# Patient Record
Sex: Female | Born: 1955
Health system: Southern US, Community
[De-identification: ages and names within clinical notes are randomized; demographics above are authoritative.]

## PROBLEM LIST (undated history)

## (undated) DIAGNOSIS — Z5189 Encounter for other specified aftercare: Secondary | ICD-10-CM

## (undated) DIAGNOSIS — D219 Benign neoplasm of connective and other soft tissue, unspecified: Secondary | ICD-10-CM

## (undated) DIAGNOSIS — E785 Hyperlipidemia, unspecified: Secondary | ICD-10-CM

## (undated) HISTORY — DX: Encounter for other specified aftercare: Z51.89

## (undated) HISTORY — PX: OTHER SURGICAL HISTORY: SHX169

## (undated) HISTORY — PX: CHOLECYSTECTOMY: SHX55

## (undated) HISTORY — PX: BREAST REDUCTION SURGERY: SHX8

## (undated) HISTORY — DX: Hyperlipidemia, unspecified: E78.5

## (undated) HISTORY — DX: Benign neoplasm of connective and other soft tissue, unspecified: D21.9

## (undated) HISTORY — PX: WISDOM TOOTH EXTRACTION: SHX21

---

## 1999-01-25 ENCOUNTER — Other Ambulatory Visit: Admission: RE | Admit: 1999-01-25 | Discharge: 1999-01-25 | Payer: Self-pay | Admitting: Obstetrics & Gynecology

## 1999-10-08 ENCOUNTER — Encounter: Admission: RE | Admit: 1999-10-08 | Discharge: 1999-10-08 | Payer: Self-pay | Admitting: Family Medicine

## 1999-10-08 ENCOUNTER — Encounter: Payer: Self-pay | Admitting: Family Medicine

## 1999-11-01 ENCOUNTER — Encounter: Payer: Self-pay | Admitting: Surgery

## 1999-11-07 ENCOUNTER — Encounter (INDEPENDENT_AMBULATORY_CARE_PROVIDER_SITE_OTHER): Payer: Self-pay | Admitting: Specialist

## 1999-11-07 ENCOUNTER — Observation Stay (HOSPITAL_COMMUNITY): Admission: RE | Admit: 1999-11-07 | Discharge: 1999-11-08 | Payer: Self-pay | Admitting: Surgery

## 2000-02-18 ENCOUNTER — Other Ambulatory Visit: Admission: RE | Admit: 2000-02-18 | Discharge: 2000-02-18 | Payer: Self-pay | Admitting: Obstetrics & Gynecology

## 2000-03-11 ENCOUNTER — Encounter: Admission: RE | Admit: 2000-03-11 | Discharge: 2000-03-11 | Payer: Self-pay | Admitting: Obstetrics & Gynecology

## 2000-03-11 ENCOUNTER — Encounter: Payer: Self-pay | Admitting: Obstetrics & Gynecology

## 2001-03-11 ENCOUNTER — Other Ambulatory Visit: Admission: RE | Admit: 2001-03-11 | Discharge: 2001-03-11 | Payer: Self-pay | Admitting: Obstetrics & Gynecology

## 2001-03-17 ENCOUNTER — Encounter: Payer: Self-pay | Admitting: Obstetrics & Gynecology

## 2001-03-17 ENCOUNTER — Encounter: Admission: RE | Admit: 2001-03-17 | Discharge: 2001-03-17 | Payer: Self-pay | Admitting: Obstetrics & Gynecology

## 2002-03-23 ENCOUNTER — Encounter: Payer: Self-pay | Admitting: Obstetrics & Gynecology

## 2002-03-23 ENCOUNTER — Encounter: Admission: RE | Admit: 2002-03-23 | Discharge: 2002-03-23 | Payer: Self-pay | Admitting: Obstetrics & Gynecology

## 2002-03-25 ENCOUNTER — Encounter: Payer: Self-pay | Admitting: Obstetrics & Gynecology

## 2002-03-25 ENCOUNTER — Encounter: Admission: RE | Admit: 2002-03-25 | Discharge: 2002-03-25 | Payer: Self-pay | Admitting: Obstetrics & Gynecology

## 2002-03-31 ENCOUNTER — Other Ambulatory Visit: Admission: RE | Admit: 2002-03-31 | Discharge: 2002-03-31 | Payer: Self-pay | Admitting: Obstetrics & Gynecology

## 2003-01-25 ENCOUNTER — Encounter: Admission: RE | Admit: 2003-01-25 | Discharge: 2003-01-25 | Payer: Self-pay | Admitting: Family Medicine

## 2003-01-25 ENCOUNTER — Encounter: Payer: Self-pay | Admitting: Family Medicine

## 2003-02-08 ENCOUNTER — Encounter: Payer: Self-pay | Admitting: Family Medicine

## 2003-02-08 ENCOUNTER — Encounter: Admission: RE | Admit: 2003-02-08 | Discharge: 2003-02-08 | Payer: Self-pay | Admitting: Family Medicine

## 2003-04-06 ENCOUNTER — Other Ambulatory Visit: Admission: RE | Admit: 2003-04-06 | Discharge: 2003-04-06 | Payer: Self-pay | Admitting: Obstetrics & Gynecology

## 2003-05-25 ENCOUNTER — Encounter: Admission: RE | Admit: 2003-05-25 | Discharge: 2003-05-25 | Payer: Self-pay | Admitting: Obstetrics & Gynecology

## 2003-05-25 ENCOUNTER — Encounter: Payer: Self-pay | Admitting: Obstetrics & Gynecology

## 2003-11-09 ENCOUNTER — Emergency Department (HOSPITAL_COMMUNITY): Admission: AD | Admit: 2003-11-09 | Discharge: 2003-11-09 | Payer: Self-pay | Admitting: Family Medicine

## 2003-11-14 ENCOUNTER — Emergency Department (HOSPITAL_COMMUNITY): Admission: AD | Admit: 2003-11-14 | Discharge: 2003-11-14 | Payer: Self-pay | Admitting: Family Medicine

## 2004-09-12 ENCOUNTER — Ambulatory Visit: Payer: Self-pay | Admitting: Family Medicine

## 2004-10-31 ENCOUNTER — Encounter: Admission: RE | Admit: 2004-10-31 | Discharge: 2004-10-31 | Payer: Self-pay | Admitting: Obstetrics & Gynecology

## 2004-11-05 ENCOUNTER — Other Ambulatory Visit: Admission: RE | Admit: 2004-11-05 | Discharge: 2004-11-05 | Payer: Self-pay | Admitting: Obstetrics & Gynecology

## 2005-06-27 ENCOUNTER — Ambulatory Visit: Payer: Self-pay | Admitting: Family Medicine

## 2005-08-26 ENCOUNTER — Encounter: Admission: RE | Admit: 2005-08-26 | Discharge: 2005-08-26 | Payer: Self-pay | Admitting: Family Medicine

## 2005-08-26 ENCOUNTER — Ambulatory Visit: Payer: Self-pay | Admitting: Family Medicine

## 2005-11-15 ENCOUNTER — Ambulatory Visit: Payer: Self-pay | Admitting: Family Medicine

## 2006-06-03 ENCOUNTER — Encounter: Admission: RE | Admit: 2006-06-03 | Discharge: 2006-06-03 | Payer: Self-pay | Admitting: Obstetrics & Gynecology

## 2006-12-16 ENCOUNTER — Ambulatory Visit: Payer: Self-pay | Admitting: Family Medicine

## 2007-06-17 ENCOUNTER — Encounter: Admission: RE | Admit: 2007-06-17 | Discharge: 2007-06-17 | Payer: Self-pay | Admitting: Specialist

## 2007-08-05 ENCOUNTER — Ambulatory Visit: Payer: Self-pay | Admitting: Family Medicine

## 2007-08-05 DIAGNOSIS — J45909 Unspecified asthma, uncomplicated: Secondary | ICD-10-CM | POA: Insufficient documentation

## 2007-08-05 DIAGNOSIS — J309 Allergic rhinitis, unspecified: Secondary | ICD-10-CM | POA: Insufficient documentation

## 2007-08-11 ENCOUNTER — Ambulatory Visit: Payer: Self-pay | Admitting: Family Medicine

## 2007-08-26 ENCOUNTER — Ambulatory Visit: Payer: Self-pay | Admitting: Cardiology

## 2007-08-26 ENCOUNTER — Ambulatory Visit: Payer: Self-pay

## 2007-09-15 ENCOUNTER — Ambulatory Visit: Payer: Self-pay | Admitting: Cardiology

## 2007-09-15 LAB — CONVERTED CEMR LAB
ALT: 17 units/L (ref 0–35)
AST: 23 units/L (ref 0–37)
Bilirubin, Direct: 0.1 mg/dL (ref 0.0–0.3)
Cholesterol: 172 mg/dL (ref 0–200)
HDL: 52.3 mg/dL (ref >39.0)
LDL Cholesterol: 97 mg/dL (ref 0–99)
Total Protein: 7.2 g/dL (ref 6.0–8.3)
Triglycerides: 114 mg/dL (ref 0–149)

## 2007-09-22 ENCOUNTER — Telehealth: Payer: Self-pay | Admitting: Family Medicine

## 2007-09-22 ENCOUNTER — Ambulatory Visit: Payer: Self-pay | Admitting: Cardiology

## 2007-11-12 ENCOUNTER — Ambulatory Visit: Payer: Self-pay | Admitting: Family Medicine

## 2007-11-12 DIAGNOSIS — M810 Age-related osteoporosis without current pathological fracture: Secondary | ICD-10-CM | POA: Insufficient documentation

## 2007-11-12 LAB — CONVERTED CEMR LAB
AST: 24 units/L (ref 0–37)
Albumin: 4 g/dL (ref 3.5–5.2)
Alkaline Phosphatase: 85 units/L (ref 39–117)
Cholesterol: 141 mg/dL (ref 0–200)
Total Bilirubin: 0.7 mg/dL (ref 0.3–1.2)

## 2007-11-20 ENCOUNTER — Telehealth: Payer: Self-pay | Admitting: Family Medicine

## 2008-06-30 ENCOUNTER — Encounter: Admission: RE | Admit: 2008-06-30 | Discharge: 2008-06-30 | Payer: Self-pay | Admitting: Obstetrics & Gynecology

## 2008-07-06 ENCOUNTER — Encounter: Admission: RE | Admit: 2008-07-06 | Discharge: 2008-07-06 | Payer: Self-pay | Admitting: Obstetrics & Gynecology

## 2008-09-02 ENCOUNTER — Ambulatory Visit: Payer: Self-pay | Admitting: Cardiology

## 2008-09-02 LAB — CONVERTED CEMR LAB
Bilirubin, Direct: 0.2 mg/dL (ref 0.0–0.3)
LDL Cholesterol: 47 mg/dL (ref 0–99)
Total Bilirubin: 0.7 mg/dL (ref 0.3–1.2)
Total CHOL/HDL Ratio: 2.3
VLDL: 10 mg/dL (ref 0–40)

## 2008-09-07 ENCOUNTER — Ambulatory Visit: Payer: Self-pay

## 2008-09-07 ENCOUNTER — Ambulatory Visit: Payer: Self-pay | Admitting: Cardiology

## 2009-05-18 ENCOUNTER — Telehealth: Payer: Self-pay | Admitting: *Deleted

## 2009-07-04 ENCOUNTER — Encounter: Admission: RE | Admit: 2009-07-04 | Discharge: 2009-07-04 | Payer: Self-pay | Admitting: Obstetrics & Gynecology

## 2009-09-13 ENCOUNTER — Telehealth: Payer: Self-pay | Admitting: Cardiology

## 2009-11-06 DIAGNOSIS — E785 Hyperlipidemia, unspecified: Secondary | ICD-10-CM | POA: Insufficient documentation

## 2009-11-10 ENCOUNTER — Encounter: Payer: Self-pay | Admitting: Cardiology

## 2009-11-13 ENCOUNTER — Ambulatory Visit: Payer: Self-pay

## 2009-11-13 ENCOUNTER — Ambulatory Visit: Payer: Self-pay | Admitting: Cardiology

## 2009-12-01 ENCOUNTER — Ambulatory Visit: Payer: Self-pay | Admitting: Cardiology

## 2009-12-07 LAB — CONVERTED CEMR LAB
BUN: 12 mg/dL (ref 6–23)
Bilirubin, Direct: 0.2 mg/dL (ref 0.0–0.3)
CO2: 32 meq/L (ref 19–32)
Chloride: 105 meq/L (ref 96–112)
Cholesterol: 145 mg/dL (ref 0–200)
Glucose, Bld: 95 mg/dL (ref 70–99)
LDL Cholesterol: 73 mg/dL (ref 0–99)
Potassium: 4.4 meq/L (ref 3.5–5.1)
Total Bilirubin: 0.7 mg/dL (ref 0.3–1.2)
Total CHOL/HDL Ratio: 3
Total Protein: 6.7 g/dL (ref 6.0–8.3)
VLDL: 18.2 mg/dL (ref 0.0–40.0)

## 2010-02-09 ENCOUNTER — Ambulatory Visit: Payer: Self-pay | Admitting: Family Medicine

## 2010-02-09 DIAGNOSIS — S61409A Unspecified open wound of unspecified hand, initial encounter: Secondary | ICD-10-CM | POA: Insufficient documentation

## 2010-02-12 ENCOUNTER — Ambulatory Visit: Payer: Self-pay | Admitting: Family Medicine

## 2010-02-28 ENCOUNTER — Telehealth: Payer: Self-pay | Admitting: Cardiology

## 2010-03-01 ENCOUNTER — Encounter: Payer: Self-pay | Admitting: *Deleted

## 2010-03-07 ENCOUNTER — Ambulatory Visit: Payer: Self-pay | Admitting: Family Medicine

## 2010-03-07 DIAGNOSIS — R5381 Other malaise: Secondary | ICD-10-CM

## 2010-03-07 DIAGNOSIS — R5383 Other fatigue: Secondary | ICD-10-CM

## 2010-03-07 LAB — CONVERTED CEMR LAB
Protein, U semiquant: NEGATIVE
Urobilinogen, UA: 0.2

## 2010-03-08 LAB — CONVERTED CEMR LAB
ALT: 26 units/L (ref 0–35)
AST: 27 units/L (ref 0–37)
Albumin: 4.3 g/dL (ref 3.5–5.2)
Basophils Relative: 0.5 % (ref 0.0–3.0)
Eosinophils Relative: 1.2 % (ref 0.0–5.0)
Free T4: 0.9 ng/dL (ref 0.6–1.6)
GFR calc non Af Amer: 97.23 mL/min (ref >60)
Glucose, Bld: 94 mg/dL (ref 70–99)
HCT: 42.8 % (ref 36.0–46.0)
Hemoglobin: 14.6 g/dL (ref 12.0–15.0)
Lymphs Abs: 1.3 10*3/uL (ref 0.7–4.0)
Monocytes Relative: 6.4 % (ref 3.0–12.0)
Neutro Abs: 2.1 10*3/uL (ref 1.4–7.7)
Potassium: 4.7 meq/L (ref 3.5–5.1)
Sodium: 142 meq/L (ref 135–145)
TSH: 0.43 microintl units/mL (ref 0.35–5.50)
WBC: 3.7 10*3/uL — ABNORMAL LOW (ref 4.5–10.5)

## 2010-03-27 ENCOUNTER — Telehealth: Payer: Self-pay | Admitting: Family Medicine

## 2010-05-22 LAB — HM MAMMOGRAPHY: HM Mammogram: NEGATIVE

## 2010-05-22 LAB — HM DIABETES EYE EXAM: HM Diabetic Eye Exam: NORMAL

## 2010-07-19 ENCOUNTER — Encounter: Admission: RE | Admit: 2010-07-19 | Discharge: 2010-07-19 | Payer: Self-pay | Admitting: Obstetrics & Gynecology

## 2010-11-19 ENCOUNTER — Encounter: Payer: Self-pay | Admitting: Obstetrics & Gynecology

## 2010-11-27 NOTE — Assessment & Plan Note (Signed)
Summary: F1Y/ANAS  Medications Added LIPITOR 40 MG TABS (ATORVASTATIN CALCIUM) 1/2 tab once daily      Allergies Added: ! ASA  Visit Type:  1 yr f/u Primary Provider:  Roderick Pee MD  CC:  no cardiac complaints today.  History of Present Illness: Hannah [name] comes in today for followup of her mixed hyperlipidemia, family history of premature coronary disease, and minimal carotid disease. She is intolerant of aspirin with easy bruisability.  She continues to work on a regular basis. However she's gained about 10-12 pounds since last year.  She is very compliant with her medications. She does start a Mediterranean type diet.  She denies any symptoms of angina or ischemia. She's had no symptoms of TIAs or mini strokes.  Current Medications (verified): 1)  Lipitor 40 Mg Tabs (Atorvastatin Calcium) .... 1/2 Tab Once Daily  Allergies (verified): 1)  ! Hannah [name]  Past History:  Past Medical History: Last updated: 11/06/2009 HYPERLIPIDEMIA-MIXED (ICD-272.4) FAMILY HISTORY OF CAD FEMALE 1ST DEGREE RELATIVE <60 (ICD-V16.49) OSTEOPOROSIS (ICD-733.00) ASTHMA (ICD-493.90) ALLERGIC RHINITIS (ICD-477.9)  Past Surgical History: Last updated: 11/06/2009 Cholecystectomy..1995 Wisdom teeth..1969 Valve for reflux in bladder/kidney..1964  Family History: Last updated: 11/06/2009 Family History of CAD Female 1st degree relative <60  Interesting in that her mother had a stroke and   coronary artery disease and has had a pacer.  She does not have coronary   disease that I know of.  She is alive at 34.  Her father was an   alcoholic and died at 23.  He had coronary disease apparently.  She has   a brother, age 55, who has coronary disease, but has had a history of   drug use including cocaine.  Family History of Hyperlipidemia:   Social History: Last updated: 11/06/2009 Married Never Smoked Drug use-no Regular exercise-yes Alcohol Use - yes..2 glasses of wine per month  Risk  Factors: Exercise: yes (08/05/2007)  Risk Factors: Smoking Status: never (08/05/2007)  Review of Systems       negative other than history of present illness  Vital Signs:  Patient profile:   55 year old female Height:      63.5 inches Weight:      147 pounds BMI:     25.72 Pulse rate:   77 / minute Pulse rhythm:   regular BP sitting:   110 / 70  (left arm) Cuff size:   large  Vitals Entered By: Hannah [name], CMA (November 13, 2009 3:16 PM)  Physical Exam  General:  Well developed, well nourished, in no acute distress. Head:  normocephalic and atraumatic Eyes:  PERRLA/EOM intact; conjunctiva and lids normal. Mouth:  Teeth, gums and palate normal. Oral mucosa normal. Neck:  Neck supple, no JVD. No masses, thyromegaly or abnormal cervical nodes. Chest Hannah [name]:  no deformities or breast masses noted Lungs:  Clear bilaterally to auscultation and percussion. Heart:  Non-displaced PMI, chest non-tender; regular rate and rhythm, S1, S2 without murmurs, rubs or gallops. Carotid upstroke normal, no bruit. Normal abdominal aortic size, no bruits. Femorals normal pulses, no bruits. Pedals normal pulses. No edema, no varicosities. Abdomen:  Bowel sounds positive; abdomen soft and non-tender without masses, organomegaly, or hernias noted. No hepatosplenomegaly. Msk:  Back normal, normal gait. Muscle strength and tone normal. Pulses:  pulses normal in all 4 extremities Extremities:  No clubbing or cyanosis. Neurologic:  Alert and oriented x 3. Skin:  Intact without lesions or rashes. Psych:  Normal affect.   Impression & Recommendations:  Problem #  1:  HYPERLIPIDEMIA-MIXED (ICD-272.4) She is due fasting blood work which we'll arrange. Goal LDL 70 or less. No change in medications. Her updated medication list for this problem includes:    Lipitor 40 Mg Tabs (Atorvastatin calcium) .Marland Kitchen... 1/2 tab once daily  Problem # 2:  CAROTID ARTERY DISEASE (ICD-433.10) Assessment:  Unchanged  Preliminary Dopplers showed no change today. Repeat in a year.  Orders: EKG w/ Interpretation (93000)  Problem # 3:  FAMILY HISTORY OF CAD FEMALE 1ST DEGREE RELATIVE <60 (ICD-V16.49) Assessment: Unchanged  Patient Instructions: 1)  Your physician recommends that you schedule a follow-up appointment in: 12 MONTHS 2)  Your physician recommends that you return for lab work ZO:XWRU LIPID LIVER AT PT'S CONVENIENCE 272.4 V58.69 3)  Your physician recommends that you continue on your current medications as directed. Please refer to the Current Medication list given to you today. Prescriptions: LIPITOR 40 MG TABS (ATORVASTATIN CALCIUM) 1/2 tab once daily  #30 x 11   Entered by:   Scherrie Bateman, LPN   Authorized by:   Gaylord Shih, MD, [facility]   Signed by:   Scherrie Bateman, LPN on 04/54/0981   Method used:   Electronically to        [facility]  [address] [phone]* (retail)       [address]       [place], [place]  [address]       Ph: [phone] or [phone]       Fax: [phone]   RxID:   [id number]

## 2010-11-27 NOTE — Progress Notes (Signed)
Summary: call  Phone Note Call from Patient Call back at Home Phone [phone]   Reason for Call: Lab or Test Results Summary of Call: blood work Initial call taken by: Rudy Jew, RN,  Mar 27, 2010 11:39 AM  Follow-up for Phone Call        please call labs all normal if the symptoms persist come back to see [place] for reevaluation Follow-up by: Roderick Pee MD,  March 28, 2010 7:53 AM  Additional Follow-up for Phone Call Additional follow up Details #1::        [facility] Additional Follow-up by: Lynann Beaver CMA,  March 28, 2010 8:17 AM    Additional Follow-up for Phone Call Additional follow up Details #2::    Left detailed message on pt's personal voice mail per pt. request. Follow-up by: Lynann Beaver CMA,  March 28, 2010 1:32 PM

## 2010-11-27 NOTE — Assessment & Plan Note (Signed)
Summary: laceration/? sutures/tetanus/dm   Reason for Visit cut on left thumb  Primary Care Provider:  Roderick Pee MD   History of Present Illness: Hannah [name] is a 55 year old many female, nonsmoker, who comes in today having cut her right thumb.  An hour ago with a sharp instrument at home.  She has a three-quarter inch laceration on the lateral aspect of her right thumb.  Superficial nerves and tendons not involved.  The wound was cleaned.  Steri-Strips were applied.  Dry sterile dressing.  She was advised to keep it clean and dry.  Return Monday for follow-up  tetanus booster 2006  Allergies: 1)  ! Asa  Review of Systems      See HPI  Physical Exam  General:  Well-developed,well-nourished,in no acute distress; alert,appropriate and cooperative throughout examination Skin:  three-quarter inch laceration in medial portion right thumb superficial nerves, tendons, not involved   Impression & Recommendations:  Problem # 1:  LACERATION, HAND, RIGHT (ICD-882.0) Assessment New  Complete Medication List: 1)  Lipitor 40 Mg Tabs (Atorvastatin calcium) .... 1/2 tab once daily  Patient Instructions: 1)  keep the dressing clean and dry.  Return Monday for follow-up

## 2010-11-27 NOTE — Assessment & Plan Note (Signed)
Summary: cpx/no pap/pt will come in fasting/njrq   Vital Signs:  Patient profile:   55 year old female Height:      63.5 inches Weight:      146 pounds Temp:     98.3 degrees F oral BP sitting:   110 / 80  (left arm) Cuff size:   regular  Vitals Entered By: Kern Reap CMA Duncan Dull) (Mar 07, 2010 8:36 AM) CC: cpx   Primary Care Provider:  Roderick Pee MD  CC:  cpx.  History of Present Illness: Hannah [name] is a 55 year old, married female, nonsmoker, who comes in today for evaluation of fatigue.  She just had this problem for a year to however, in the past 6 months got worse.  She's tired all the time by 3 o'clock.  She is exhausted.  She has no fever, earache, sore throat, cough, nausea, vomiting, diarrhea, weight loss, urinary  tract symptoms, et Karie Soda.  Pelvic examination recently by Dr. Lloyd Huger was normal.  She also has marked decrease in libido.  He's tried many different medications all of which give her side effects.  The only medication she takes is Lipitor 20 mg nightly for hyperlipidemia.  Her sleep is dysfunctional and that she has nocturia x 3, but is able to go back to sleep.  She gets routine eye care.,   Dental care,  does BSE monthly.  Gets annual mammography.  Colonoscopy by Dr. Kinnie Scales showed some polyps.  She is due to go back for follow-up in a couple years.  Tetanus 2006 seasonal flu 2010  She states her mood is good.  She is not been depressed.  Family history negative.,,except for her mother at age 41, was recently diagnosed with breast cancer and had a mastectomy, however, she's doing well  Allergies: 1)  ! Asa  Past History:  Past medical, surgical, family and social histories (including risk factors) reviewed, and no changes noted (except as noted below).  Past Medical History: Reviewed history from 11/06/2009 and no changes required. HYPERLIPIDEMIA-MIXED (ICD-272.4) FAMILY HISTORY OF CAD FEMALE 1ST DEGREE RELATIVE <60 (ICD-V16.49) OSTEOPOROSIS  (ICD-733.00) ASTHMA (ICD-493.90) ALLERGIC RHINITIS (ICD-477.9)  Past Surgical History: Reviewed history from 11/06/2009 and no changes required. Cholecystectomy..1995 Wisdom teeth..1969 Valve for reflux in bladder/kidney..1964  Family History: Reviewed history from 11/06/2009 and no changes required. Family History of CAD Female 1st degree relative <60  Interesting in that her mother had a stroke and   coronary artery disease and has had a pacer.  She does not have coronary   disease that I know of.  She is alive at 46.  Her father was an   alcoholic and died at 59.  He had coronary disease apparently.  She has   a brother, age 67, who has coronary disease, but has had a history of   drug use including cocaine.  Family History of Hyperlipidemia:   Social History: Reviewed history from 11/06/2009 and no changes required. Married Never Smoked Drug use-no Regular exercise-yes Alcohol Use - yes..2 glasses of wine per month  Review of Systems      See HPI  Physical Exam  General:  Well-developed,well-nourished,in no acute distress; alert,appropriate and cooperative throughout examination Head:  Normocephalic and atraumatic without obvious abnormalities. No apparent alopecia or balding. Eyes:  No corneal or conjunctival inflammation noted. EOMI. Perrla. Funduscopic exam benign, without hemorrhages, exudates or papilledema. Vision grossly normal. Ears:  External ear exam shows no significant lesions or deformities.  Otoscopic examination reveals clear canals, tympanic membranes  are intact bilaterally without bulging, retraction, inflammation or discharge. Hearing is grossly normal bilaterally. Nose:  External nasal examination shows no deformity or inflammation. Nasal mucosa are pink and moist without lesions or exudates. Mouth:  Oral mucosa and oropharynx without lesions or exudates.  Teeth in good repair. Neck:  No deformities, masses, or tenderness noted. Chest Wall:  No  deformities, masses, or tenderness noted. Breasts:  diffuse fibrocystic changes Lungs:  Normal respiratory effort, chest expands symmetrically. Lungs are clear to auscultation, no crackles or wheezes. Heart:  Normal rate and regular rhythm. S1 and S2 normal without gallop, murmur, click, rub or other extra sounds. Abdomen:  Bowel sounds positive,abdomen soft and non-tender without masses, organomegaly or hernias noted. Msk:  No deformity or scoliosis noted of thoracic or lumbar spine.   Pulses:  R and L carotid,radial,femoral,dorsalis pedis and posterior tibial pulses are full and equal bilaterally Extremities:  No clubbing, cyanosis, edema, or deformity noted with normal full range of motion of all joints.   Neurologic:  No cranial nerve deficits noted. Station and gait are normal. Plantar reflexes are down-going bilaterally. DTRs are symmetrical throughout. Sensory, motor and coordinative functions appear intact. Skin:  Intact without suspicious lesions or rashes Cervical Nodes:  No lymphadenopathy noted Axillary Nodes:  No palpable lymphadenopathy Inguinal Nodes:  No significant adenopathy Psych:  Cognition and judgment appear intact. Alert and cooperative with normal attention span and concentration. No apparent delusions, illusions, hallucinations   Problems:  Medical Problems Added: 1)  Dx of Fatigue  (ICD-780.79)  Impression & Recommendations:  Problem # 1:  FATIGUE (ICD-780.79)  Orders: Venipuncture ([address]) UA Dipstick w/o Micro (automated)  (81003) TLB-BMP (Basic Metabolic Panel-BMET) (80048-METABOL) TLB-CBC Platelet - w/Differential (85025-CBCD) TLB-Hepatic/Liver Function Pnl (80076-HEPATIC) TLB-TSH (Thyroid Stimulating Hormone) (84443-TSH) TLB-B12 + Folate Pnl (60454_09811-B14/NWG) TLB-IBC Pnl (Iron/FE;Transferrin) (83550-IBC) TLB-T3, Free (Triiodothyronine) (84481-T3FREE) TLB-T4 (Thyrox), Free [phone])  Problem # 2:  HYPERLIPIDEMIA-MIXED (ICD-272.4) Assessment:  Improved  Her updated medication list for this problem includes:    Lipitor 40 Mg Tabs (Atorvastatin calcium) .Marland Kitchen... 1/2 tab once daily  Complete Medication List: 1)  Lipitor 40 Mg Tabs (Atorvastatin calcium) .... 1/2 tab once daily  Patient Instructions: 1)  I will call you I get your lab work back. 2)  Consider stopping the Lipitor for a week or two to see if this may be a contributing factor   Immunization History:  Influenza Immunization History:    Influenza:  historical (07/28/2009)   Laboratory Results   Urine Tests  Date/Time Received: Mar 07, 2010 9:28 AM  Date/Time Reported: Mar 07, 2010 9:27 AM   Routine Urinalysis   Color: yellow Appearance: Clear Glucose: negative   (Normal Range: Negative) Bilirubin: small   (Normal Range: Negative) Ketone: negative   (Normal Range: Negative) Spec. Gravity: >=1.030   (Normal Range: 1.003-1.035) Blood: trace-lysed   (Normal Range: Negative) pH: 5.0   (Normal Range: 5.0-8.0) Protein: negative   (Normal Range: Negative) Urobilinogen: 0.2   (Normal Range: 0-1) Nitrite: negative   (Normal Range: Negative) Leukocyte Esterace: negative   (Normal Range: Negative)    Comments: Kathrynn Speed CMA  Mar 07, 2010 9:28 AM

## 2010-11-27 NOTE — Miscellaneous (Signed)
Summary: Orders Update  Clinical Lists Changes  Problems: Added new problem of CAROTID ARTERY DISEASE (ICD-433.10) Orders: Added new Test order of Carotid Duplex (Carotid Duplex) - Signed 

## 2010-11-27 NOTE — Miscellaneous (Signed)
Summary: eye exam update   Clinical Lists Changes  Observations: Added new observation of EYES COMMENT: 02/2011 (03/01/2010 17:18) Added new observation of EYE EXAM BY: Huey opth (02/26/2010 17:19) Added new observation of DMEYEEXMRES: normal (02/26/2010 17:19) Added new observation of DIAB EYE EX: normal (02/26/2010 17:19)      Diabetes Management History:      She says that she is exercising.    Diabetes Management Exam:    Eye Exam:       Eye Exam done elsewhere          Date: 02/26/2010          Results: normal          Done by: Maggie Schwalbe

## 2010-11-27 NOTE — Progress Notes (Signed)
Summary: pt has questions and concerns   Phone Note Call from Patient Call back at Home Phone [phone]   Caller: Patient Reason for Call: Talk to Nurse, Talk to Doctor Summary of Call: pt wants to have lab work sent to Dr. Heywood Footman  her dentist fax# [phone]  e-mail [email or web address] and Dr. Tawanna Cooler she is having aluminum taste in her mouth for about 3wks and she is very weak and tired Initial call taken by: Omer Jack,  Feb 28, 2010 10:09 AM  Follow-up for Phone Call        [facility] Scherrie Bateman, LPN  Mar 01, 2951 1:56 PM SPOKE WITH PT  RE MESSAGE SAW DENTIST TODAY MOUTH WASH GIVEN DENTIST NOTED FRACTURED TOOTH NOT SURE ABOUT METAL TASTE IN MOUTH ALSO HAS APPT WITH DR TODD ON TUES FOR CPX  PT C/O FATIGUE AND WEAKNESS AND ALSO TASTE IN MOUTH WILL GET EVAL AT DR TODD'S OFF ON TUES INSTRUCTED TO CALL BACK IF NO IMPROVEMENT  IN S/S OR DEVELOPS DIFFER S/S. VERBALIZED UNDERSTANDING. Follow-up by: Scherrie Bateman, LPN,  Feb 29, 8412 2:10 PM

## 2010-11-27 NOTE — Assessment & Plan Note (Signed)
Summary: fu per doc/8.15am/njr   Primary Care Provider:  Roderick Pee MD   History of Present Illness: Hannah [name] is a 55 year old, married female, nonsmoker, who comes in today for reevaluation of a laceration to her left thumb.  We saw her last Friday.  We closed the wound with Steri-Strips and applied a bulky dressing.  Advised to keep it clean and dry.  She comes back today for follow-up.  No complaints.  The dressing was removed.  The wound was inspected.  It looks normal.  No sign of infection.  Allergies: 1)  ! Asa  Physical Exam  General:  Well-developed,well-nourished,in no acute distress; alert,appropriate and cooperative throughout examination Skin:  wound clean with no sign of infection   Impression & Recommendations:  Problem # 1:  LACERATION, HAND, RIGHT (ICD-882.0) Assessment Improved  Complete Medication List: 1)  Lipitor 40 Mg Tabs (Atorvastatin calcium) .... 1/2 tab once daily  Patient Instructions: 1)  clean daily apply the Band-Aids daily.  Leave it open to the air at night.  After one week u may move her thumb,,but until then, keep it splinted. 2)  Return for your CPX

## 2011-01-03 ENCOUNTER — Telehealth: Payer: Self-pay | Admitting: Cardiology

## 2011-01-08 NOTE — Progress Notes (Signed)
Summary: QUESTIONS ABOUT HAVING LABS DRAWN   Phone Note Call from Patient Call back at Home Phone [phone]   Caller: Patient Summary of Call: PT CALLING WITH QUESTIONS ABOUT WHEN LABS ARE DUE CAN LEAVE MESS ON VOICE MAIL Initial call taken by: Judie Grieve,  January 03, 2011 11:08 AM  Follow-up for Phone Call        LMOVM to have fasting lipid/liver drawn the week of April 9th prior to 02/11/11 appt with Dr. Daleen Squibb Follow-up by: Lisabeth Devoid RN,  January 03, 2011 1:21 PM     Appended Document: QUESTIONS ABOUT HAVING LABS DRAWN agree

## 2011-02-04 ENCOUNTER — Other Ambulatory Visit: Payer: Self-pay | Admitting: *Deleted

## 2011-02-06 ENCOUNTER — Other Ambulatory Visit (INDEPENDENT_AMBULATORY_CARE_PROVIDER_SITE_OTHER): Payer: Self-pay | Admitting: *Deleted

## 2011-02-06 ENCOUNTER — Encounter: Payer: Self-pay | Admitting: Cardiology

## 2011-02-06 DIAGNOSIS — E785 Hyperlipidemia, unspecified: Secondary | ICD-10-CM

## 2011-02-06 LAB — HEPATIC FUNCTION PANEL
ALT: 24 U/L (ref 0–35)
AST: 21 U/L (ref 0–37)
Bilirubin, Direct: 0 mg/dL (ref 0.0–0.3)
Total Protein: 6.8 g/dL (ref 6.0–8.3)

## 2011-02-06 LAB — LIPID PANEL
Cholesterol: 125 mg/dL (ref 0–200)
HDL: 56.2 mg/dL (ref >39.00)
VLDL: 16 mg/dL (ref 0.0–40.0)

## 2011-02-07 ENCOUNTER — Telehealth: Payer: Self-pay | Admitting: *Deleted

## 2011-02-07 NOTE — Telephone Encounter (Signed)
Message copied by Lisabeth Devoid on Thu Feb 07, 2011  5:31 PM ------      Message from: Valera Castle      Created: Thu Feb 07, 2011  1:23 PM       EXCELLENT, NO CHANGE.

## 2011-02-07 NOTE — Telephone Encounter (Signed)
Cholesterol results given to pt. She has increased her exercise and changed her diet since last seeing Dr. Daleen Squibb.  She has lost 16 pounds. She will also be out of her lipitor by weekend but does not want it refilled until after being seen by Dr. Daleen Squibb on Monday. I will forward this to Dr. Daleen Squibb. Mylo Red RN

## 2011-02-11 ENCOUNTER — Encounter: Payer: Self-pay | Admitting: Cardiology

## 2011-02-11 ENCOUNTER — Ambulatory Visit (INDEPENDENT_AMBULATORY_CARE_PROVIDER_SITE_OTHER): Payer: 59 | Admitting: Cardiology

## 2011-02-11 VITALS — BP 120/80 | HR 74 | Resp 12 | Wt 140.0 lb

## 2011-02-11 DIAGNOSIS — I6529 Occlusion and stenosis of unspecified carotid artery: Secondary | ICD-10-CM

## 2011-02-11 DIAGNOSIS — Z0389 Encounter for observation for other suspected diseases and conditions ruled out: Secondary | ICD-10-CM

## 2011-02-11 DIAGNOSIS — E785 Hyperlipidemia, unspecified: Secondary | ICD-10-CM

## 2011-02-11 DIAGNOSIS — Z0000 Encounter for general adult medical examination without abnormal findings: Secondary | ICD-10-CM

## 2011-02-11 MED ORDER — ATORVASTATIN CALCIUM 40 MG PO TABS: 40.0000 mg | ORAL_TABLET | Freq: Every day | ORAL | Status: DC

## 2011-02-11 NOTE — Assessment & Plan Note (Signed)
Repeat in 1/13.

## 2011-02-11 NOTE — Progress Notes (Signed)
   Patient ID: ELDRED LIEVANOS, female    DOB: 1956/06/15, 55 y.o.   MRN: [id number]  HPI  Shawnique returns today for her MHL and carotid bruit. She has lost 20 lbs and is working out daily. Recent lipids were good even though she was halving her dose. Carotids stable with mild plaque in 1/11.  EKG show NSR, stable.  She wants a prescription for her shingles vaccine.  Review of Systems    Physical Exam  Nursing note and vitals reviewed. Constitutional: She is oriented to person, place, and time. She appears well-developed and well-nourished.  HENT:  Head: Atraumatic.  Eyes: EOM are normal. Pupils are equal, round, and reactive to light.  Neck: Neck supple. No JVD present. No tracheal deviation present. No thyromegaly present.       Soft right bruit  Cardiovascular: Normal rate, regular rhythm, normal heart sounds and intact distal pulses.   No murmur heard. Pulmonary/Chest: Effort normal and breath sounds normal. She has no wheezes. She has no rales.  Abdominal: Soft. Bowel sounds are normal.  Musculoskeletal: Normal range of motion. She exhibits no edema.  Neurological: She is alert and oriented to person, place, and time.  Skin: Skin is warm and dry.  Psychiatric: She has a normal mood and affect.

## 2011-02-11 NOTE — Patient Instructions (Addendum)
Your physician recommends that you schedule a follow-up appointment in: 1 year with Dr. Daleen Squibb  Hannah [name] should have the shingles vaccine at her Primary Care Office.

## 2011-02-11 NOTE — Assessment & Plan Note (Signed)
Refill Lipitor at 40mg /day

## 2011-03-05 ENCOUNTER — Telehealth: Payer: Self-pay | Admitting: Cardiology

## 2011-03-05 NOTE — Telephone Encounter (Signed)
Routing this to debbie not sure if she knows this pt and if Dr. Daleen Squibb writes shingles shot for this particular pt. Pt is at pharmacy getting a shingles shot needs a prescription for the vaccination before he allowed to get it

## 2011-03-05 NOTE — Telephone Encounter (Signed)
Pharmacy calling for shingles shot order.  Pt has seen Dr. Daleen Squibb in April. He recommended pt to follow-up with pcp for shingles shot. Pharmacy will call pt. Mylo Red RN

## 2011-03-05 NOTE — Telephone Encounter (Signed)
Pt at the pharmacy for a shingle shot. Pharmacist states he needs a written scription for the shot.

## 2011-03-06 ENCOUNTER — Other Ambulatory Visit: Payer: Self-pay | Admitting: Family Medicine

## 2011-03-12 NOTE — Assessment & Plan Note (Signed)
[facility]                            CARDIOLOGY OFFICE NOTE   NAME:HOLDERNESSReizy, Dunlow                  MRN:          [id number]  DATE:08/26/2007                            DOB:          Nov 25, 1955    CARDIOLOGY CONSULTATION:  I was asked by Dr. Alonza Smoker to consult on  Britt Aguillard about her hyperlipidemia, as well as the questioned  need for estrogen.   HISTORY OF PRESENT ILLNESS:  Hannah [name] is a delightful 55 year old friend  of mine, married white female, with a family history of ischemic heart  disease, now mixed hyperlipidemia, who comes with the above questions.  She exercises about 30 minutes on an elliptical trainer daily, a 3 mile  walk 4-5 times a week.  She gets a little winded at first, but then it  goes away.  She has had no angina.  She says she has always had this  since she was a child.   She does not smoke.  She drinks about 2 glasses of wine a month.  She  struggles with her weight sometimes.   FAMILY HISTORY:  Interesting in that her mother had a stroke and  coronary artery disease and has had a pacer.  She does not have coronary  disease that I know of.  She is alive at 8.  Her father was an  alcoholic and died at 64.  He had coronary disease apparently.  She has  a brother, age 10, who has coronary disease, but has had a history of  drug use including cocaine.   The other day she had lipids checked.  She had a total cholesterol of  287, triglycerides of 253, LDL of 181, HDL of 55.  Total cholesterol and  HDL ratio of 5.2.  Her TSH was normal.  Chemistry was normal.  LFT's  were normal.  Blood sugar was 95.  CBC was normal.   She was started on simvastatin 20 mg p.o. daily.  She has been taking it  about 3 weeks.   Her other question is about her need for estrogen.  She had problems  with being very jittery and nervous on birth control pills in the past.  She has hypertriglyceridemia.  She brings in questions about  this from  the Heart Health studies in the past.   PAST MEDICAL HISTORY:  In addition to the above, she has no know  allergies.   SURGICAL HISTORY:  1. She has had valve for reflux in the bladder and kidney when she was      55 years old in 1964.  2. She has had wisdom teeth extracted in middle school.  3. She has had a cholecystectomy in 1995.   CURRENT MEDICATIONS:  1. Vitamin D.  2. Simvastatin 20 mg a day.   ALLERGIES/INTOLERANCES:  She bruises very easily ASPIRIN, just one a day  or even one several times a week.   SOCIAL HISTORY:  She is married.  She has 3 children.  She is a  housewife.  She enjoys gardening, [employer] work.   REVIEW OF SYSTEMS:  Negative, other  than in the HPI.   PHYSICAL EXAMINATION:  VITAL SIGNS:  On exam today, her blood pressure  was 114/90, pulse 71 and regular.  She is 5 feet, 3.5 inches, weight 146  pounds.  HEENT:  Normocephalic and atraumatic.  Pupils equal, round and reactive  to light and accommodation.  Extraocular movements intact.  Sclerae are  clear.  She is fairly emotional, tearful.  Facial symmetry is normal.  Dentition is satisfactory.  NECK:  Carotid upstrokes are equal bilaterally.  She has a soft left  carotid bruit.  Thyroid is not enlarged.  Trachea is midline.  LUNGS:  Clear.  HEART:  A non-displaced PMI.  She has a normal S1 and S2.  ABDOMEN:  Abdominal exam is soft, good bowel sounds.  No midline or  flank bruit.  EXTREMITIES:  No clubbing, cyanosis, or edema.  Pulses are brisk.  NEUROLOGIC:  Intact.  SKIN:  Unremarkable, except for some freckling.   ELECTROCARDIOGRAM:  Complete normal.   ASSESSMENT AND PLAN:  1. Mixed hyperlipidemia, most likely familial.  I do not think diet or      lack of exercise is contributing to this.  She certainly does not      drink much.  2. Left carotid bruit.  3. Intolerance to estrogen in the past, and she has      hypertriglyceridemia.  4. Family history of coronary  disease, though there are other risk      factors with her relatives as outlined above.   RECOMMENDATIONS:  1. Carotid Dopplers.  2. Continue simvastatin 20 mg q.h.s. with fasting lipids and LFT's.  I      would want her LDL well below 100, and simvastatin 20 may not do      this.  3. Avoid aspirin with the easy bruising.  I do not think the risk is      worth the benefit.  4. No estrogen at this time.  Four reasons for this is that she did      not tolerate it in the past symptomatically, she has no vasomotor      or other symptoms of estrogen deficiency, she has      hypertriglyceridemia, and the data is mixed on estrogen      replacement, even in women her age.   I will see her back in about 4 weeks to follow up on her lipids and  answer any further questions.     Thomas C. Daleen Squibb, MD, [facility]  Electronically Signed    TCW/MedQ  DD: 08/26/2007  DT: 08/26/2007  Job #: [id number]   cc:   Tinnie Gens A. Tawanna Cooler, MD  W. Varney Baas, M.D.

## 2011-03-12 NOTE — Assessment & Plan Note (Signed)
[facility]                            CARDIOLOGY OFFICE NOTE   NAME:HOLDERNESSIrania, [name]                  MRN:          [id number]  DATE:09/22/2007                            DOB:          1956/04/24    SUBJECTIVE:  Hannah [name] returns today to discuss the findings of her lipid  panel, on Simvastatin 20 mg daily.  We also obtained carotid Dopplers  because of hearing a soft left carotid bruit on exam.   Her total cholesterol decreased from 250 to 172.  Her triglycerides have  dropped from 253 to 114.  HDL dropped from 55 to 52.3, LDL dropped from  181 to 97.  Total cholesterol to HDL ratio was 3.3, which is excellent.  Her liver function tests were normal.   Her carotid Dopplers showed non-obstructed plaque in the bulbs.  Her mid  to distal internal carotid arteries were tortuous.  Her vertebral  arteries were patent with antegrade flow.   I had about a 20-minute discussion today with Hannah [name].  I would really  like to see her LDL lower.  The goal would be 60-70, with plaque already  present with her risk factors.   PLAN:  1. Change Simvastatin to Lipitor 40 mg daily.  2. Follow up lipids and liver function tests in six weeks.  She would      like to have this drawn at Dr. Lacretia Nicks. Rudi Heap office, so she can      have her vitamin D checked.  3. No estrogen.  4. No aspirin because of easy bruising.  5. Continue her therapeutic life style changes.  6. See me back in one year.  At that time we will repeat carotid      Dopplers.     Thomas C. Daleen Squibb, MD, [facility]  Electronically Signed    TCW/MedQ  DD: 09/22/2007  DT: 09/22/2007  Job #: [id number]   cc:   Freddy Finner, M.D.  Jeffrey A. Tawanna Cooler, MD

## 2011-03-12 NOTE — Assessment & Plan Note (Signed)
[facility]                            CARDIOLOGY OFFICE NOTE   NAME:HOLDERNESSMilisa, Hannah [name]                  MRN:          [id number]  DATE:09/07/2008                            DOB:          Mar 18, 1956    Hannah Hannah [name] comes in today for followup for her mild carotid disease, which  is asymptomatic, and her mixed hyperlipidemia.   She has got incredibly serious about exercise, diet, and taking good  care of herself.  She exercises about an hour a day.  She is doing spins  and other cardio as well as some resistance training.   She has lost 10 pounds.  She feels remarkably well.  She is totally  asymptomatic.   MEDICATIONS:  Her current meds are Lipitor 40 mg a day.  Dr. Jennette Kettle wants  her to take estrogen, and she has brought in an article today that he  sent me personally!  It shows that in women of her age that estrogens  carry low risk for coronary artery disease or stroke.  I have told her I  conferred and to go ahead and start estrogen at this point in her life.  I will forward this note to Dr. Jennette Kettle and thank him for the input.   PHYSICAL EXAMINATION:  GENERAL:  She looks remarkably good today.  VITAL SIGNS:  Her blood pressure is 110/70, her pulse is 57, and she is  in sinus brady.  EKG is otherwise normal.  Her weight is 136.  HEENT:  Normal.  NECK:  Carotids upstrokes are equal bilaterally with really no obvious  bruit today.  Thyroid is not enlarged.  Trachea is midline.  LUNGS:  Clear to auscultation and percussion.  HEART:  Reveals a nondisplaced PMI.  Normal S1 and S2.  No gallop.  ABDOMEN:  Soft.  Good bowel sounds.  No midline bruit.  No hepatomegaly.  EXTREMITIES:  No cyanosis, clubbing, or edema.  Pulses are intact.  NEUROLOGIC:  Intact.   Again, her preliminary carotids show stable plaque.  This is  nonobstructive with antegrade flow in both vertebrals.   Her total cholesterol is 100, her HDL is 51, her total cholesterol-HDL  ratio is  2.3, LDL is 47, VLDL is 10.  LFTs are normal.   I have had a long talk with Hannah Hannah [name] today.  I am extremely impressed with  her therapeutic lifestyle choices.  At this point, we can cut her  Lipitor back to 20 a day and followup lipids in 6 months with LFTs.  I  think, she will still be at goal LDL of 60-70.  Perhaps, this will  increase her HDL as  well.  We will repeat her carotids in a year.  In addition, she can take  estrogens as outlined by Dr. Jennette Kettle.  I will see her back in a year.     Thomas C. Daleen Squibb, MD, [facility]  Electronically Signed    TCW/MedQ  DD: 09/07/2008  DT: 09/08/2008  Job #: [id number]   cc:   Freddy Finner, M.D.

## 2011-09-09 ENCOUNTER — Other Ambulatory Visit: Payer: Self-pay | Admitting: Obstetrics & Gynecology

## 2011-09-09 DIAGNOSIS — Z1231 Encounter for screening mammogram for malignant neoplasm of breast: Secondary | ICD-10-CM

## 2011-09-11 ENCOUNTER — Other Ambulatory Visit: Payer: Self-pay

## 2011-09-11 DIAGNOSIS — E785 Hyperlipidemia, unspecified: Secondary | ICD-10-CM

## 2011-09-11 MED ORDER — ATORVASTATIN CALCIUM 40 MG PO TABS: 40.0000 mg | ORAL_TABLET | Freq: Every day | ORAL | Status: DC

## 2011-09-16 ENCOUNTER — Telehealth: Payer: Self-pay

## 2011-09-16 DIAGNOSIS — E785 Hyperlipidemia, unspecified: Secondary | ICD-10-CM

## 2011-09-16 MED ORDER — ATORVASTATIN CALCIUM 40 MG PO TABS: 40.0000 mg | ORAL_TABLET | Freq: Every day | ORAL | Status: DC

## 2011-09-17 NOTE — Telephone Encounter (Signed)
FU Call: Pt returning call to Nellie to inform her that pt would like 90 days supply of RX for generic Lipitor called into [facility] . Please return pt call to discuss further.

## 2011-09-18 MED ORDER — ATORVASTATIN CALCIUM 40 MG PO TABS: 40.0000 mg | ORAL_TABLET | Freq: Every day | ORAL | Status: DC

## 2011-09-18 NOTE — Telephone Encounter (Signed)
FU: Call pt at home to reorder Lipitor to [facility]. Discuss the difference in process of cost & service of pharmacy. Pt would rather have mail order on certain medication until further notice.

## 2011-10-11 ENCOUNTER — Ambulatory Visit
Admission: RE | Admit: 2011-10-11 | Discharge: 2011-10-11 | Disposition: A | Discharge status: Home / Self Care | Payer: 59 | Source: Ambulatory Visit | Attending: Obstetrics & Gynecology | Admitting: Obstetrics & Gynecology

## 2011-10-11 DIAGNOSIS — Z1231 Encounter for screening mammogram for malignant neoplasm of breast: Secondary | ICD-10-CM

## 2012-05-14 ENCOUNTER — Telehealth: Payer: Self-pay | Admitting: Family Medicine

## 2012-05-14 NOTE — Telephone Encounter (Signed)
Caller: Hannah [name]/Patient; PCP: Hannah [name].; CB#: [phone];  Call regarding Diarrhea/ Has Been Exposed To C. Diff;  Reports sudden onset of diarrhea that smells like C-Diff and has mucus. Onset: 05/14/12.  Afebrile. Has been caring for mom in [place] who was hospitalized with C-Diff.  Clay colored tool this morning 05/14/12 and diarrhea has foul odor.  Advised to see UC (d/t out of town) within 24 hrs per nursing judgement d/t exposure to C-Diff for change in character of bowel movements per Diarrhea or Other Change in Bowel Habits.

## 2012-05-21 ENCOUNTER — Telehealth: Payer: Self-pay | Admitting: Family Medicine

## 2012-05-21 NOTE — Telephone Encounter (Signed)
Caller: Sherri/Mother; PCP: Roderick Pee.; CB#: [phone]; Call regarding Has Questions On Cdiff and Whether She Needs Meds Or Not; She contracted illness while caring for her mother.  She is on Rx and asks if she needs to continue the medication. She is headed back to [place]; concerns about joint and muscle aches and headache, some visual disturbance when exposed to sunlight- relieved with use of sunglasses.  Pt. reports she currently has 2 formed stools per day.  Urine is darker than usual.  Call provider within 4 hours per Allergic Reaction, Severe protocol.  Caller states she is currently on 9 hour drive from [place] and will not arrive home until 1900.  Advised note being sent to provider and scheduled appointment at 0930 on 05/22/12 with Dr. Clent Ridges -in case she needs that.  Pt. asks if you do not get her to please try to call her again if no answer or reach voice mail  [place].

## 2012-05-21 NOTE — Telephone Encounter (Signed)
Spoke with pt- instructed to continued meds - keep appt tomorrow. If sx get worse - to ER

## 2012-05-22 ENCOUNTER — Ambulatory Visit (INDEPENDENT_AMBULATORY_CARE_PROVIDER_SITE_OTHER): Payer: 59 | Admitting: Family Medicine

## 2012-05-22 ENCOUNTER — Encounter: Payer: Self-pay | Admitting: Family Medicine

## 2012-05-22 VITALS — BP 110/78 | HR 72 | Temp 98.8°F | Wt 139.0 lb

## 2012-05-22 DIAGNOSIS — K529 Noninfective gastroenteritis and colitis, unspecified: Secondary | ICD-10-CM

## 2012-05-22 MED ORDER — VANCOMYCIN HCL 125 MG PO CAPS: 125.0000 mg | ORAL_CAPSULE | Freq: Four times a day (QID) | ORAL | Status: AC

## 2012-05-22 MED ORDER — METRONIDAZOLE 500 MG PO TABS: 500.0000 mg | ORAL_TABLET | Freq: Four times a day (QID) | ORAL | Status: AC

## 2012-05-22 NOTE — Progress Notes (Signed)
  Subjective:    Patient ID: GEORGENE KOPPER, female    DOB: Apr 18, 1956, 56 y.o.   MRN: [id number]  HPI Here to check on a probable C diff infection. She had been staying in [place], [place] with her mother, who was hospitalized with a C diff infection. She is back home now, but Deane also developed some cramps and loose stools there last week. She saw an Urgent Care there on 05-15-12, and was told she probably had C diff too. No stool studies were done. She was put on a 10 day course of Flagyl and Vancomycin. Now she feels better except for some muscle aches. Her BMs are back to normal. No fevers.    Review of Systems  Constitutional: Negative.   Respiratory: Negative.   Cardiovascular: Negative.   Gastrointestinal: Negative.        Objective:   Physical Exam  Constitutional: She appears well-developed and well-nourished.  Abdominal: Soft. Bowel sounds are normal. She exhibits no distension and no mass. There is no tenderness. There is no rebound and no guarding.          Assessment & Plan:  Probable C diff infection that appears to be resolving. Advised her to finish out her meds. Add an Align capsule daily. Recheck prn

## 2012-06-08 ENCOUNTER — Telehealth: Payer: Self-pay | Admitting: Family Medicine

## 2012-06-08 NOTE — Telephone Encounter (Signed)
Caller: [name]/Patient; Patient Name: Hannah [name]; PCP: Roderick Pee.; Best Callback Phone Number: [phone]. Pt states she contracted C-Diff while visiting her mother.  Pt was treated with Flagyl and Vancomycin.  Pt finished antibiotics on 05/24/12.  Pt states that she now has a hemorrhoid and she is also having a lot of stomach rumbling.  Afebrile. All emergent symptoms of Rectal Symptoms ruled out with exception to 'Pain in rectum or rectal area AND hemorrhoids that have not been evaluated or self care is not working'.  Home care advice given and appt scheduled for 06/10/12 at 10:30 (unable to make appt with PCP on this day)

## 2012-06-10 ENCOUNTER — Encounter: Payer: Self-pay | Admitting: Family

## 2012-06-10 ENCOUNTER — Ambulatory Visit (INDEPENDENT_AMBULATORY_CARE_PROVIDER_SITE_OTHER): Payer: 59 | Admitting: Family

## 2012-06-10 VITALS — BP 126/74 | HR 83 | Temp 98.7°F | Wt 139.0 lb

## 2012-06-10 DIAGNOSIS — K649 Unspecified hemorrhoids: Secondary | ICD-10-CM

## 2012-06-10 DIAGNOSIS — A0472 Enterocolitis due to Clostridium difficile, not specified as recurrent: Secondary | ICD-10-CM

## 2012-06-10 MED ORDER — HYDROCORTISONE 2.5 % RE CREA: TOPICAL_CREAM | Freq: Two times a day (BID) | RECTAL | Status: AC

## 2012-06-10 NOTE — Progress Notes (Signed)
  Subjective:    Patient ID: Hannah [name], female    DOB: 05/19/1956, 56 y.o.   MRN: [id number]  HPI 56 year old WF is in today with improving diarrhea after suspected C-diff colitis. She was treated with Vanc and Cipro and is doing much better. She has brought a stool sample today because she has had large bulks of stool. However, it is not diarrhea anymore. She has developed a hemorrhoids. She has not used any medication for treatment.    Review of Systems  Constitutional: Negative.   Respiratory: Negative.   Cardiovascular: Negative.   Gastrointestinal: Positive for diarrhea. Negative for nausea and anal bleeding.  Genitourinary: Negative.   Musculoskeletal: Negative.   Skin: Negative.   Neurological: Negative.   Hematological: Negative.   Psychiatric/Behavioral: Negative.    Past Medical History  Diagnosis Date  . Hyperlipidemia   . Osteoporosis   . Asthma   . Allergic rhinitis     History   Social History  . Marital Status: Married    Spouse Name: N/A    Number of Children: N/A  . Years of Education: N/A   Occupational History  . Not on file.   Social History Main Topics  . Smoking status: Never Smoker   . Smokeless tobacco: Never Used  . Alcohol Use: Yes     twice a month  . Drug Use: No  . Sexually Active: Not on file   Other Topics Concern  . Not on file   Social History Narrative  . No narrative on file    Past Surgical History  Procedure Date  . Cholecystectomy   . Wisdom tooth extraction     Family History  Problem Relation Age of Onset  . Stroke Mother   . Alcohol abuse Father   . Heart disease Father   . Heart disease Brother 12  . Drug abuse Brother   . Hyperlipidemia Other     Allergies  Allergen Reactions  . Aspirin     REACTION: bruises very easily    No current outpatient prescriptions on file prior to visit.    BP 126/74  Pulse 83  Temp 98.7 F (37.1 C) (Oral)  Wt 139 lb (63.05 kg)  SpO2 98%chart      Objective:   Physical Exam  Constitutional: She is oriented to person, place, and time. She appears well-developed and well-nourished.  Neck: Normal range of motion. Neck supple.  Cardiovascular: Normal rate, regular rhythm and normal heart sounds.   Pulmonary/Chest: Effort normal and breath sounds normal.  Abdominal: Soft. Bowel sounds are normal.       Hemorrhoid tag noted externally.   Neurological: She is alert and oriented to person, place, and time.  Skin: Skin is warm and dry.  Psychiatric: She has a normal mood and affect.          Assessment & Plan:  Assessment: Hemorrhoids-external, Diarrhea  Plan: Stool Culture sent. Hydrocortisone supp in the rectum twice a day x 5 days. Call the office if symptoms worsen or persist. Recheck as scheduled and as needed.

## 2012-06-10 NOTE — Patient Instructions (Addendum)

## 2012-11-13 ENCOUNTER — Other Ambulatory Visit: Payer: Self-pay | Admitting: Obstetrics & Gynecology

## 2012-11-13 DIAGNOSIS — Z1231 Encounter for screening mammogram for malignant neoplasm of breast: Secondary | ICD-10-CM

## 2012-12-14 ENCOUNTER — Ambulatory Visit
Admission: RE | Admit: 2012-12-14 | Discharge: 2012-12-14 | Disposition: A | Discharge status: Home / Self Care | Payer: 59 | Source: Ambulatory Visit | Attending: Obstetrics & Gynecology | Admitting: Obstetrics & Gynecology

## 2012-12-14 DIAGNOSIS — Z1231 Encounter for screening mammogram for malignant neoplasm of breast: Secondary | ICD-10-CM

## 2013-01-27 ENCOUNTER — Other Ambulatory Visit: Payer: Self-pay

## 2013-08-02 ENCOUNTER — Ambulatory Visit: Payer: 59 | Admitting: Cardiovascular Disease

## 2013-09-09 LAB — HM COLONOSCOPY

## 2013-09-13 ENCOUNTER — Other Ambulatory Visit (INDEPENDENT_AMBULATORY_CARE_PROVIDER_SITE_OTHER): Payer: 59

## 2013-09-13 DIAGNOSIS — Z0000 Encounter for general adult medical examination without abnormal findings: Secondary | ICD-10-CM

## 2013-09-13 DIAGNOSIS — E785 Hyperlipidemia, unspecified: Secondary | ICD-10-CM

## 2013-09-13 LAB — TSH: TSH: 1.03 u[IU]/mL (ref 0.35–5.50)

## 2013-09-13 LAB — POCT URINALYSIS DIPSTICK
Leukocytes, UA: NEGATIVE
Nitrite, UA: NEGATIVE
Protein, UA: NEGATIVE
Urobilinogen, UA: 0.2

## 2013-09-13 LAB — CBC WITH DIFFERENTIAL/PLATELET
Basophils Relative: 0.5 % (ref 0.0–3.0)
Eosinophils Absolute: 0.1 10*3/uL (ref 0.0–0.7)
Lymphs Abs: 1.4 10*3/uL (ref 0.7–4.0)
MCHC: 34.1 g/dL (ref 30.0–36.0)
MCV: 89.4 fl (ref 78.0–100.0)
Monocytes Absolute: 0.3 10*3/uL (ref 0.1–1.0)
Neutro Abs: 2.2 10*3/uL (ref 1.4–7.7)
Neutrophils Relative %: 55 % (ref 43.0–77.0)
RBC: 4.68 Mil/uL (ref 3.87–5.11)

## 2013-09-13 LAB — BASIC METABOLIC PANEL
CO2: 29 mEq/L (ref 19–32)
Chloride: 103 mEq/L (ref 96–112)
Creatinine, Ser: 0.7 mg/dL (ref 0.4–1.2)

## 2013-09-13 LAB — HEPATIC FUNCTION PANEL
Albumin: 4 g/dL (ref 3.5–5.2)
Bilirubin, Direct: 0 mg/dL (ref 0.0–0.3)
Total Protein: 7 g/dL (ref 6.0–8.3)

## 2013-09-13 LAB — LIPID PANEL: Cholesterol: 271 mg/dL — ABNORMAL HIGH (ref 0–200)

## 2013-09-20 ENCOUNTER — Encounter: Payer: 59 | Admitting: Family Medicine

## 2013-09-30 ENCOUNTER — Encounter: Payer: 59 | Admitting: Family Medicine

## 2013-10-12 ENCOUNTER — Encounter: Payer: Self-pay | Admitting: Family Medicine

## 2013-10-12 ENCOUNTER — Ambulatory Visit (INDEPENDENT_AMBULATORY_CARE_PROVIDER_SITE_OTHER): Payer: 59 | Admitting: Family Medicine

## 2013-10-12 VITALS — BP 140/80 | Temp 98.5°F | Ht 63.25 in | Wt 146.0 lb

## 2013-10-12 DIAGNOSIS — J309 Allergic rhinitis, unspecified: Secondary | ICD-10-CM

## 2013-10-12 DIAGNOSIS — Z0000 Encounter for general adult medical examination without abnormal findings: Secondary | ICD-10-CM

## 2013-10-12 DIAGNOSIS — J45909 Unspecified asthma, uncomplicated: Secondary | ICD-10-CM

## 2013-10-12 DIAGNOSIS — I6529 Occlusion and stenosis of unspecified carotid artery: Secondary | ICD-10-CM

## 2013-10-12 MED ORDER — ALBUTEROL SULFATE HFA 108 (90 BASE) MCG/ACT IN AERS: 2.0000 | INHALATION_SPRAY | Freq: Four times a day (QID) | RESPIRATORY_TRACT | Status: DC | PRN

## 2013-10-12 NOTE — Progress Notes (Signed)
   Subjective:    Patient ID: Hannah [name], female    DOB: January 01, 1956, 57 y.o.   MRN: [id number]  HPI Hannah [name] is a 57 year old married female nonsmoker G2 P2 married female who comes in today for general physical examination  She's always been in excellent health he said no chronic health problems recently she had a colonoscopy because of polyps. She was then found to have a stricture in her upper esophagus. Which was dilated by Dr. Kinnie [name] her gastroenterologist. Hannah [name] also taking Meprazole 20 mg daily  She gets routine eye care, dental care, BSE monthly, and you mammography,  This year she had a reduction mammoplasty.  She occasionally she's exposed to fumes will wheezes.   Review of Systems  Constitutional: Negative.   HENT: Negative.   Eyes: Negative.   Respiratory: Negative.   Cardiovascular: Negative.   Gastrointestinal: Negative.   Genitourinary: Negative.   Musculoskeletal: Negative.   Neurological: Negative.   Psychiatric/Behavioral: Negative.        Objective:   Physical Exam  Constitutional: She appears well-developed and well-nourished.  HENT:  Head: Normocephalic and atraumatic.  Right Ear: External ear normal.  Left Ear: External ear normal.  Nose: Nose normal.  Mouth/Throat: Oropharynx is clear and moist.  Eyes: EOM are normal. Pupils are equal, round, and reactive to light.  Neck: Normal range of motion. Neck supple. No thyromegaly present.  Cardiovascular: Normal rate, regular rhythm, normal heart sounds and intact distal pulses.  Exam reveals no gallop and no friction rub.   No murmur heard. Pulmonary/Chest: Effort normal and breath sounds normal.  Abdominal: Soft. Bowel sounds are normal. She exhibits no distension and no mass. There is no tenderness. There is no rebound.  Genitourinary:  Pelvic and Pap done by GYN  Bilateral breast exam shows scars from previous reduction mammoplasty well-healed no palpable masses  Musculoskeletal: Normal range  of motion.  Lymphadenopathy:    She has no cervical adenopathy.  Neurological: She is alert. She has normal reflexes. No cranial nerve deficit. She exhibits normal muscle tone. Coordination normal.  Skin: Skin is warm and dry.  Psychiatric: She has a normal mood and affect. Her behavior is normal. Judgment and thought content normal.          Assessment & Plan:  Healthy female  History of asthma related to exposure to fumes,,,,,,,, albuterol when necessary  Reflux esophagitis with a stricture dilated by Dr. Kinnie [name] advised to continue omeprazole 20 mg daily  Status post bilateral breast reduction

## 2013-10-12 NOTE — Patient Instructions (Signed)
Where your sunscreens in the summer as we discussed  If the lesion on your anterior chest wall continues to be problematic called make an appointment and I will remove it for you  Walk 30 minutes daily  Followup in one year for general medical exam  The current recommendations are Pap smear every 3 years  BSE monthly and annual mammography  If you're having any difficulty swallowing make an appointment to see Dr. Kinnie Scales

## 2013-10-12 NOTE — Progress Notes (Signed)
Pre visit review using our clinic review tool, if applicable. No additional management support is needed unless otherwise documented below in the visit note. 

## 2013-12-24 ENCOUNTER — Other Ambulatory Visit: Payer: Self-pay

## 2013-12-24 DIAGNOSIS — Z1231 Encounter for screening mammogram for malignant neoplasm of breast: Secondary | ICD-10-CM

## 2013-12-24 DIAGNOSIS — Z9889 Other specified postprocedural states: Secondary | ICD-10-CM

## 2014-01-10 ENCOUNTER — Ambulatory Visit
Admission: RE | Admit: 2014-01-10 | Discharge: 2014-01-10 | Disposition: A | Discharge status: Home / Self Care | Payer: 59 | Source: Ambulatory Visit

## 2014-01-10 DIAGNOSIS — Z1231 Encounter for screening mammogram for malignant neoplasm of breast: Secondary | ICD-10-CM

## 2014-01-10 DIAGNOSIS — Z9889 Other specified postprocedural states: Secondary | ICD-10-CM

## 2014-04-07 ENCOUNTER — Ambulatory Visit (INDEPENDENT_AMBULATORY_CARE_PROVIDER_SITE_OTHER): Payer: 59 | Admitting: Family Medicine

## 2014-04-07 ENCOUNTER — Encounter: Payer: Self-pay | Admitting: Family Medicine

## 2014-04-07 VITALS — BP 120/80 | Temp 98.4°F | Wt 137.0 lb

## 2014-04-07 DIAGNOSIS — L678 Other hair color and hair shaft abnormalities: Secondary | ICD-10-CM

## 2014-04-07 DIAGNOSIS — L738 Other specified follicular disorders: Secondary | ICD-10-CM

## 2014-04-07 DIAGNOSIS — H1013 Acute atopic conjunctivitis, bilateral: Secondary | ICD-10-CM | POA: Insufficient documentation

## 2014-04-07 DIAGNOSIS — H1045 Other chronic allergic conjunctivitis: Secondary | ICD-10-CM

## 2014-04-07 DIAGNOSIS — L739 Follicular disorder, unspecified: Secondary | ICD-10-CM | POA: Insufficient documentation

## 2014-04-07 DIAGNOSIS — L729 Follicular cyst of the skin and subcutaneous tissue, unspecified: Secondary | ICD-10-CM

## 2014-04-07 MED ORDER — CEPHALEXIN 500 MG PO CAPS: ORAL_CAPSULE | ORAL | Status: DC

## 2014-04-07 NOTE — Progress Notes (Signed)
   Subjective:    Patient ID: Hannah [name], female    DOB: [birth year], 58 y.o.   MRN: [id number]  HPI Hannah [name] is a 58 year old married female nonsmoker who comes in today for evaluation of 3 problems  For the past 2 days her eyes have been red and itching. She is a history of allergic rhinitis  She's also noticed some white lines in the tips of her fingernails  Also for the last couple months she's had recurrent outbreaks of pimples on her face and scalp.   Review of Systems Review of systems otherwise negative    Objective:   Physical Exam  Well-developed and nourished female no acute distress vital signs stable she is afebrile examination the skin shows multiple small pustules on the neck and the face  Eyes are normal except for some slight conjunctival erythema  Fingernails normal except for some white lines distally      Assessment & Plan:  Allergic rhinitis............... allergic eyes...Marland KitchenMarland KitchenMarland Kitchen Claritin and Vasocon eyedrops  Folliculitis recurrent,,,,,,,,,, Keflex 500 twice a day for 10 days  Lines on her fingernails.... [facility] consult

## 2014-04-07 NOTE — Patient Instructions (Signed)
For the folliculitis,,,,,,,,,, Keflex 500 mg,,,,,,,,, one twice daily for 10 days  For your irritated eyes..........Marland Kitchen plain Claritin 10 mg daily............vasocon a eye drops......... one drop 4 times daily  Dermot consult with Amy Martinique when necessary.

## 2014-04-07 NOTE — Progress Notes (Signed)
Pre visit review using our clinic review tool, if applicable. No additional management support is needed unless otherwise documented below in the visit note. 

## 2014-04-08 ENCOUNTER — Telehealth: Payer: Self-pay | Admitting: Family Medicine

## 2014-04-08 MED ORDER — OLOPATADINE HCL 0.2 % OP SOLN: 1.0000 [drp] | Freq: Every day | OPHTHALMIC | Status: DC

## 2014-04-08 NOTE — Telephone Encounter (Signed)
Pt called to say that she went to the pharmacy for eye drops that Dr Sherren Mocha had recommended Vasocon she was told by the pharmacy that this is no longer over the counter they went ahead and ordered this for her but still need a rx from Dr Sherren Mocha. Pharmacy [facility] . PT IS LEAVING TOWN TODAY

## 2014-04-08 NOTE — Telephone Encounter (Signed)
Spoke with pharmacist and Duanne Limerick is not available OTC, will have to be ordered, and insurance usually does not cover.  pharmacist advised OTC drop or Pat[facility].  Left message on machine for patient about OTC and Rx sent.

## 2014-06-16 ENCOUNTER — Encounter: Payer: Self-pay | Admitting: Family Medicine

## 2014-06-16 ENCOUNTER — Ambulatory Visit (INDEPENDENT_AMBULATORY_CARE_PROVIDER_SITE_OTHER): Payer: 59 | Admitting: Family Medicine

## 2014-06-16 ENCOUNTER — Telehealth: Payer: Self-pay | Admitting: Family Medicine

## 2014-06-16 VITALS — BP 140/68 | HR 80 | Temp 99.8°F | Wt 136.0 lb

## 2014-06-16 DIAGNOSIS — K5733 Diverticulitis of large intestine without perforation or abscess with bleeding: Secondary | ICD-10-CM

## 2014-06-16 MED ORDER — CIPROFLOXACIN HCL 500 MG PO TABS: 500.0000 mg | ORAL_TABLET | Freq: Two times a day (BID) | ORAL | Status: DC

## 2014-06-16 MED ORDER — METRONIDAZOLE 500 MG PO TABS: 500.0000 mg | ORAL_TABLET | Freq: Three times a day (TID) | ORAL | Status: DC

## 2014-06-16 NOTE — Progress Notes (Signed)
  Garret Reddish, MD Phone: [phone]  Subjective:   Hannah [name] is a 58 y.o. year old very pleasant female patient who presents with the following:  Fever/lower abdominal pain Yesterday, started with aching all over, fever to 101.1, lower abdominal cramping (L>R). A lot of pressure to have bowel movement but minimal would come out other than small mucusy blood tinged drops. Still able to maintain hydration. Symptoms very mildly better as she has no fever and cramping is intermittent. Oct 2014-diverticulosis and a few polyps removed ROS-No diarrhea. Mild nausea but no vomiting. Decreased appetite. Has had a recent cough/congestion which seemed to get worse over last day. No recent tick bites.   Past Medical History-  HLD, osteoporosis, asthma, seasonal allergies Past surgical history- still has her appendix, cholecystectomy  Medications- reviewed and updated Current Outpatient Prescriptions  Medication Sig Dispense Refill  . albuterol (PROVENTIL HFA;VENTOLIN HFA) 108 (90 BASE) MCG/ACT inhaler Inhale 2 puffs into the lungs every 6 (six) hours as needed for wheezing or shortness of breath.  1 Inhaler  1   No current facility-administered medications for this visit.    Objective: BP 140/68  Pulse 80  Temp(Src) 99.8 F (37.7 C)  Wt 136 lb (61.689 kg) Gen: NAD, resting comfortably on table HEENT: Mucous membranes are moist. CV: RRR no murmurs rubs or gallops, occasional cough Lungs: CTAB no crackles, wheeze, rhonchi Abdomen: soft/Moderately Tender in LLQ/nondistended/normal bowel sounds. No rebound or guarding.  Ext: no edema Skin: warm, dry, no rash  Assessment/Plan:  Diverticulitis uncomplicated Cipro/flagyl x 10 days. Follow up on Monday with Dr. Sherren Mocha. Advised stool softener. Stay in town until follow up with Dr. Sherren Mocha. Red flags for sooner return given.   Meds ordered this encounter  Medications  . ciprofloxacin (CIPRO) 500 MG tablet    Sig: Take 1 tablet (500 mg  total) by mouth 2 (two) times daily.    Dispense:  20 tablet    Refill:  0  . metroNIDAZOLE (FLAGYL) 500 MG tablet    Sig: Take 1 tablet (500 mg total) by mouth 3 (three) times daily.    Dispense:  30 tablet    Refill:  0

## 2014-06-16 NOTE — Telephone Encounter (Signed)
Patient has an appointment with Dr Yong Channel this afternoon.

## 2014-06-16 NOTE — Telephone Encounter (Signed)
Patient Information:  Caller Name: Sherrilynn  Phone: [phone]  Patient: Hannah [name], Ped[place]  Gender: Female  DOB: 1956-06-02  Age: 58 Years  PCP: Stevie Kern [facility])  Office Follow Up:  Does the office need to follow up with this patient?: Yes  Instructions For The Office: Apolonio Schneiders stated she would call pt back with appt today with Dr. Sherren Mocha  RN Note:  Spoke to office staff who stated they will call pt back momentarily to get pt in with Dr. Sherren Mocha today. Pt agreed to plan.  Symptoms  Reason For Call & Symptoms: intermittent lower abd cramping with fever (tmax 101.1) and joint aches and everytime she feels like she has to have a BM only a small amount of blood tinged mucus comes out but no real stool.  Reviewed Health History In EMR: Yes  Reviewed Medications In EMR: Yes  Reviewed Allergies In EMR: Yes  Reviewed Surgeries / Procedures: Yes  Date of Onset of Symptoms: 06/15/2014  Guideline(s) Used:  Abdominal Pain - Female  Disposition Per Guideline:   See Today in Office  Reason For Disposition Reached:   Moderate or mild pain that comes and goes (cramps) lasts > 24 hours  Advice Given:  Call Back If:  You become worse.  Patient Will Follow Care Advice:  YES

## 2014-06-16 NOTE — Patient Instructions (Signed)
Take antibiotics for 10 days. Complete full course Please check in with [place] if not improving or sooner if symptoms worsen.   Diverticulitis Diverticulitis is inflammation or infection of small pouches in your colon that form when you have a condition called diverticulosis. The pouches in your colon are called diverticula. Your colon, or large intestine, is where water is absorbed and stool is formed. Complications of diverticulitis can include:  Bleeding.  Severe infection.  Severe pain.  Perforation of your colon.  Obstruction of your colon. CAUSES  Diverticulitis is caused by bacteria. Diverticulitis happens when stool becomes trapped in diverticula. This allows bacteria to grow in the diverticula, which can lead to inflammation and infection. RISK FACTORS People with diverticulosis are at risk for diverticulitis. Eating a diet that does not include enough fiber from fruits and vegetables may make diverticulitis more likely to develop. SYMPTOMS  Symptoms of diverticulitis may include:  Abdominal pain and tenderness. The pain is normally located on the left side of the abdomen, but may occur in other areas.  Fever and chills.  Bloating.  Cramping.  Nausea.  Vomiting.  Constipation.  Diarrhea.  Blood in your stool. DIAGNOSIS  Your health care provider will ask you about your medical history and do a physical exam. You may need to have tests done because many medical conditions can cause the same symptoms as diverticulitis. Tests may include:  Blood tests.  Urine tests.  Imaging tests of the abdomen, including X-rays and CT scans. When your condition is under control, your health care provider may recommend that you have a colonoscopy. A colonoscopy can show how severe your diverticula are and whether something else is causing your symptoms. TREATMENT  Most cases of diverticulitis are mild and can be treated at home. Treatment may include:  Taking over-the-counter  pain medicines.  Following a clear liquid diet.  Taking antibiotic medicines by mouth for 7-10 days. More severe cases may be treated at a hospital. Treatment may include:  Not eating or drinking.  Taking prescription pain medicine.  Receiving antibiotic medicines through an IV tube.  Receiving fluids and nutrition through an IV tube.  Surgery. HOME CARE INSTRUCTIONS   Follow your health care provider's instructions carefully.  Follow a full liquid diet or other diet as directed by your health care provider. After your symptoms improve, your health care provider may tell you to change your diet. He or she may recommend you eat a high-fiber diet. Fruits and vegetables are good sources of fiber. Fiber makes it easier to pass stool.  Take fiber supplements or probiotics as directed by your health care provider.  Only take medicines as directed by your health care provider.  Keep all your follow-up appointments. SEEK MEDICAL CARE IF:   Your pain does not improve.  You have a hard time eating food.  Your bowel movements do not return to normal. SEEK IMMEDIATE MEDICAL CARE IF:   Your pain becomes worse.  Your symptoms do not get better.  Your symptoms suddenly get worse.  You have a fever.  You have repeated vomiting.  You have bloody or black, tarry stools. MAKE SURE YOU:   Understand these instructions.  Will watch your condition.  Will get help right away if you are not doing well or get worse. Document Released: 07/24/2005 Document Revised: 10/19/2013 Document Reviewed: 09/08/2013 [facility] Patient Information 2015 [place], [place]. This information is not intended to replace advice given to you by your health care provider. Make sure you discuss  any questions you have with your health care provider.  

## 2014-06-20 ENCOUNTER — Ambulatory Visit (INDEPENDENT_AMBULATORY_CARE_PROVIDER_SITE_OTHER): Payer: 59 | Admitting: Family Medicine

## 2014-06-20 ENCOUNTER — Ambulatory Visit: Payer: 59 | Admitting: Family Medicine

## 2014-06-20 ENCOUNTER — Encounter: Payer: Self-pay | Admitting: Family Medicine

## 2014-06-20 VITALS — BP 110/64 | HR 60 | Temp 98.5°F | Wt 132.0 lb

## 2014-06-20 DIAGNOSIS — K5733 Diverticulitis of large intestine without perforation or abscess with bleeding: Secondary | ICD-10-CM

## 2014-06-20 NOTE — Patient Instructions (Signed)
Glad you are doing so much better.  Complete the full course of antibiotics.  [place] think [place] now.  Wait at least a full 7 days since starting treatment to be a little more active.

## 2014-06-20 NOTE — Progress Notes (Signed)
  Garret Reddish, MD Phone: [phone]  Subjective:   Hannah [name] is a 58 y.o. year old very pleasant female patient who presents with the following:  Diverticulitis followup Patient diagnosed with diverticulitis last week. She was started on Cipro and Flagyl and this is day 5. She is tolerating the medication well. She does have mild diarrhea 3 episodes in the last day. She's been progressing on her diet and had toast and an egg yesterday morning. She would like to progress more on her diet. [place]. She denies any fevers since the last day she was seen. She states her abdominal pain has resolved almost completely and she can only feel it if she presses deeply in her left lower quadrant.  Yesterday, started with aching all over, fever to 101.1, lower abdominal cramping (L>R). A lot of pressure to have bowel movement but minimal would come out other than small mucusy blood tinged drops. Still able to maintain hydration. Symptoms very mildly better as she has no fever and cramping is intermittent. Oct 2014-diverticulosis and a few polyps removed ROS- no further blood tinged stool. decreased appetite but nausea has mostly resolved.  Past Medical History-  HLD, osteoporosis, asthma, seasonal allergies. Nonsmoker   Medications- reviewed and updated Current Outpatient Prescriptions  Medication Sig Dispense Refill  . ciprofloxacin (CIPRO) 500 MG tablet Take 1 tablet (500 mg total) by mouth 2 (two) times daily.  20 tablet  0  . metroNIDAZOLE (FLAGYL) 500 MG tablet Take 1 tablet (500 mg total) by mouth 3 (three) times daily.  30 tablet  0  . albuterol (PROVENTIL HFA;VENTOLIN HFA) 108 (90 BASE) MCG/ACT inhaler Inhale 2 puffs into the lungs every 6 (six) hours as needed for wheezing or shortness of breath.  1 Inhaler  1   No current facility-administered medications for this visit.    Objective: BP 110/64  Pulse 60  Temp(Src) 98.5 F (36.9 C)  Wt 132  lb (59.875 kg) Gen: NAD, resting comfortably on table HEENT: Mucous membranes are moist. CV: RRR no murmurs rubs or gallops, occasional cough Lungs: CTAB no crackles, wheeze, rhonchi Abdomen: soft/very mild tenderness to deep palpation in LLQ/nondistended/normal bowel sounds. No rebound or guarding.  Ext: no edema Skin: warm, dry, no rash  Assessment/Plan:  Diverticulitis uncomplicated Cipro/flagyl x 10 days total now on day 5. Encouraged patient to complete course. She is much improved-she can progress in her diet and she may [place] the mountains. Discussed red flags to return to care. Bleeding has resolved at this time.

## 2014-11-03 ENCOUNTER — Ambulatory Visit (INDEPENDENT_AMBULATORY_CARE_PROVIDER_SITE_OTHER): Payer: 59 | Admitting: Family Medicine

## 2014-11-03 ENCOUNTER — Encounter: Payer: Self-pay | Admitting: Family Medicine

## 2014-11-03 VITALS — BP 130/80 | Temp 98.4°F | Wt 142.0 lb

## 2014-11-03 DIAGNOSIS — E785 Hyperlipidemia, unspecified: Secondary | ICD-10-CM

## 2014-11-03 DIAGNOSIS — Z87440 Personal history of urinary (tract) infections: Secondary | ICD-10-CM

## 2014-11-03 LAB — POCT URINALYSIS DIPSTICK
Bilirubin, UA: NEGATIVE
Glucose, UA: NEGATIVE
KETONES UA: NEGATIVE
Leukocytes, UA: NEGATIVE
Nitrite, UA: NEGATIVE
PH UA: 8
PROTEIN UA: NEGATIVE
RBC UA: NEGATIVE
Spec Grav, UA: 1.01
UROBILINOGEN UA: 0.2

## 2014-11-03 MED ORDER — PRAVASTATIN SODIUM 10 MG PO TABS: 10.0000 mg | ORAL_TABLET | Freq: Every day | ORAL | Status: DC

## 2014-11-03 NOTE — Patient Instructions (Signed)
Continue diet and exercise  Pravachol 10 mg..... One tablet daily at bedtime  Return in 2 months for follow-up  Fasting labs one week prior

## 2014-11-03 NOTE — Progress Notes (Signed)
Pre visit review using our clinic review tool, if applicable. No additional management support is needed unless otherwise documented below in the visit note. 

## 2014-11-03 NOTE — Progress Notes (Signed)
   Subjective:    Patient ID: Hannah [name], female    DOB: 1955-12-22, 59 y.o.   MRN: [id number]  HPI Hannah [name] is a 59 year old married female nonsmoker who comes in today for evaluation abnormal labs drawn by her gynecologist  She says she went to see her gynecologist for complete physical exam they did some labs and they were abnormal therefore they referred her to [place] for evaluation  Her CBC shows a marginal white cell count of 3500. Previous CBCs of all shown marginal white counts. Differential is normal therefore this is not abnormal  About 5 years ago her HDL was normal however LDL 1. We put on a statin her LDL dropped to 100. Her mother and father both have vascular disease have multiple operations. She tried to manage her lipids with diet and exercise but recent lipid panel shows an LDL of 82. She would like to discuss all the options  They also told her that she had blood in the urine. She's never had trouble with blood in urine and she's had no urologic symptoms. Urinalysis was not repeated. Repeat urinalysis here is normal   Review of Systems    review of systems otherwise negative Objective:   Physical Exam  Well-developed well-nourished female no acute distress vital signs stable she's afebrile a 25 minute review of all her lab work and explanations      Assessment & Plan:  Normal CBC  Elevated  LDL cholesterol with a positive family history of vascular disease.......Marland Kitchen recommended restarting the statin follow-up in 2 months

## 2015-01-03 ENCOUNTER — Telehealth: Payer: Self-pay | Admitting: Family Medicine

## 2015-01-03 MED ORDER — PRAVASTATIN SODIUM 10 MG PO TABS: 10.0000 mg | ORAL_TABLET | Freq: Every day | ORAL | Status: DC

## 2015-01-03 NOTE — Telephone Encounter (Signed)
Pt had to cancel her 2 mo fup b/c her mom in in the hospital in [place], pt will not be back for another 2 weeks. However, dr todd is out until 4/11 and pt wants to know if it is ok to put off her appt that far? Pt started a new rx and was to be rechecked in 2 mos.  Pt states she will see someone else when she gets back if dr todd wants her to, Otherwise/ pt resc appt to 4/11 and lab week prior. Pt will also be out of pravastatin (PRAVACHOL) 10 MG tablet  April 7.. pls advise

## 2015-01-03 NOTE — Telephone Encounter (Signed)
Patient can either wait or see someone else when Dr Sherren Mocha is out

## 2015-01-03 NOTE — Telephone Encounter (Signed)
Please call patient.  Refill sent.  Patient can either

## 2015-01-04 NOTE — Telephone Encounter (Signed)
Noted.  Okay for patient to wait.

## 2015-01-04 NOTE — Telephone Encounter (Signed)
Pt is willing to wait as long as its ok w/ dr todd. The only reason she wuld see someone else is if dr todd thinks she needs to be seen

## 2015-01-09 ENCOUNTER — Other Ambulatory Visit: Payer: 59

## 2015-01-12 ENCOUNTER — Ambulatory Visit: Payer: 59 | Admitting: Family Medicine

## 2015-01-12 ENCOUNTER — Other Ambulatory Visit: Payer: 59

## 2015-01-17 ENCOUNTER — Ambulatory Visit: Payer: 59 | Admitting: Family Medicine

## 2015-01-31 ENCOUNTER — Other Ambulatory Visit (INDEPENDENT_AMBULATORY_CARE_PROVIDER_SITE_OTHER): Payer: 59

## 2015-01-31 DIAGNOSIS — E785 Hyperlipidemia, unspecified: Secondary | ICD-10-CM

## 2015-01-31 LAB — HEPATIC FUNCTION PANEL
ALT: 17 U/L (ref 0–35)
AST: 18 U/L (ref 0–37)
Albumin: 4 g/dL (ref 3.5–5.2)
Alkaline Phosphatase: 82 U/L (ref 39–117)
BILIRUBIN DIRECT: 0.1 mg/dL (ref 0.0–0.3)
TOTAL PROTEIN: 6.7 g/dL (ref 6.0–8.3)
Total Bilirubin: 0.4 mg/dL (ref 0.2–1.2)

## 2015-01-31 LAB — LIPID PANEL
CHOL/HDL RATIO: 5
CHOLESTEROL: 203 mg/dL — AB (ref 0–200)
HDL: 44.6 mg/dL (ref >39.00)
NONHDL: 158.4
TRIGLYCERIDES: 320 mg/dL — AB (ref 0.0–149.0)
VLDL: 64 mg/dL — AB (ref 0.0–40.0)

## 2015-01-31 LAB — LDL CHOLESTEROL, DIRECT: LDL DIRECT: 80 mg/dL

## 2015-02-06 ENCOUNTER — Ambulatory Visit (INDEPENDENT_AMBULATORY_CARE_PROVIDER_SITE_OTHER): Payer: 59 | Admitting: Family Medicine

## 2015-02-06 VITALS — BP 120/62 | Temp 97.7°F | Wt 141.6 lb

## 2015-02-06 DIAGNOSIS — E785 Hyperlipidemia, unspecified: Secondary | ICD-10-CM | POA: Diagnosis not present

## 2015-02-06 MED ORDER — PRAVASTATIN SODIUM 20 MG PO TABS: 20.0000 mg | ORAL_TABLET | Freq: Every day | ORAL | Status: DC

## 2015-02-06 NOTE — Patient Instructions (Signed)
Pravachol 20 mg daily in the morning  Resume your diet and exercise program  Follow-up lipid panel in September

## 2015-02-06 NOTE — Progress Notes (Signed)
   Subjective:    Patient ID: Hannah [name], female    DOB: Apr 12, 1956, 59 y.o.   MRN: [id number]  HPI Hannah [name] is a 59 year old married female nonsmoker comes in today for follow-up of hyperlipidemia  We started her on Pravachol 10 mg daily cousin of high cholesterol. Also triglycerides are elevated. On 10 mg a day her LDL was 80. Because of extensive family history of vascular disease I would recommend we get below 7. HDL is normal 45 total cholesterol 203 triglycerides up to 320. She says she's been with her mother's is been ill and is been offered diet and exercise and eating a lot of fast foods.   Review of Systems Review of systems otherwise negative    Objective:   Physical Exam  Well-developed well-nourished female no acute distress vital signs stable she's afebrile      Assessment & Plan:  Hyperlipidemia not at goal,,,,,, resume diet and exercise program,,,,,, increase Pravachol to 20 mg daily, follow-up lipid panel in September

## 2015-02-27 ENCOUNTER — Other Ambulatory Visit: Payer: Self-pay

## 2015-02-27 DIAGNOSIS — Z1231 Encounter for screening mammogram for malignant neoplasm of breast: Secondary | ICD-10-CM

## 2015-03-07 ENCOUNTER — Ambulatory Visit: Payer: 59

## 2015-03-21 ENCOUNTER — Other Ambulatory Visit: Payer: Self-pay | Admitting: Radiology

## 2015-03-29 ENCOUNTER — Ambulatory Visit: Payer: 59

## 2015-07-24 ENCOUNTER — Other Ambulatory Visit (INDEPENDENT_AMBULATORY_CARE_PROVIDER_SITE_OTHER): Payer: 59

## 2015-07-24 DIAGNOSIS — E785 Hyperlipidemia, unspecified: Secondary | ICD-10-CM

## 2015-07-24 LAB — LIPID PANEL
CHOLESTEROL: 269 mg/dL — AB (ref 0–200)
HDL: 58.8 mg/dL (ref >39.00)
NONHDL: 209.71
Total CHOL/HDL Ratio: 5
Triglycerides: 242 mg/dL — ABNORMAL HIGH (ref 0.0–149.0)
VLDL: 48.4 mg/dL — ABNORMAL HIGH (ref 0.0–40.0)

## 2015-07-24 LAB — LDL CHOLESTEROL, DIRECT: Direct LDL: 183 mg/dL

## 2015-08-01 ENCOUNTER — Telehealth: Payer: Self-pay | Admitting: Family Medicine

## 2015-08-01 DIAGNOSIS — E785 Hyperlipidemia, unspecified: Secondary | ICD-10-CM

## 2015-08-01 NOTE — Telephone Encounter (Signed)
Would like a call back about lab results she had done on 07/24/15  Pt said it was ok to leave a message with the results if she does not answer.

## 2015-08-02 NOTE — Telephone Encounter (Signed)
Per Dr Sherren Mocha, patient's lipid panel has not improved.  If she is taking her medication it should be increased to 40 mg.  If she is not taking her medication then she should starting taking it.  Either way patient should return for a lipid panel in 2 months.  Left message on machine for patient to return our call.

## 2015-08-03 NOTE — Telephone Encounter (Signed)
Patient has been dealing with the death of her mother.  She does not want to change her medication at this time.  She has started exercising and eating better.  Lab appointment made.

## 2015-10-10 ENCOUNTER — Other Ambulatory Visit: Payer: 59

## 2015-11-02 ENCOUNTER — Other Ambulatory Visit (INDEPENDENT_AMBULATORY_CARE_PROVIDER_SITE_OTHER): Payer: 59

## 2015-11-02 DIAGNOSIS — E785 Hyperlipidemia, unspecified: Secondary | ICD-10-CM

## 2015-11-02 LAB — LIPID PANEL
CHOLESTEROL: 261 mg/dL — AB (ref 0–200)
HDL: 60.7 mg/dL (ref >39.00)
LDL Cholesterol: 167 mg/dL — ABNORMAL HIGH (ref 0–99)
NonHDL: 200.71
TRIGLYCERIDES: 171 mg/dL — AB (ref 0.0–149.0)
Total CHOL/HDL Ratio: 4
VLDL: 34.2 mg/dL (ref 0.0–40.0)

## 2015-11-06 ENCOUNTER — Other Ambulatory Visit: Payer: Self-pay | Admitting: Family Medicine

## 2015-11-06 MED ORDER — PRAVASTATIN SODIUM 40 MG PO TABS: 40.0000 mg | ORAL_TABLET | Freq: Every day | ORAL | Status: DC

## 2015-11-06 MED ORDER — ALBUTEROL SULFATE HFA 108 (90 BASE) MCG/ACT IN AERS: 2.0000 | INHALATION_SPRAY | Freq: Four times a day (QID) | RESPIRATORY_TRACT | Status: DC | PRN

## 2015-12-26 ENCOUNTER — Telehealth: Payer: Self-pay | Admitting: Family Medicine

## 2015-12-26 NOTE — Telephone Encounter (Signed)
Pt call to say she still has a cough and some wheezing and would like a call back to discuss some options   Pt said she has had this for 6 weeks    [phone].

## 2015-12-27 MED ORDER — HYDROCODONE-HOMATROPINE 5-1.5 MG/5ML PO SYRP: 5.0000 mL | ORAL_SOLUTION | Freq: Three times a day (TID) | ORAL | Status: DC | PRN

## 2015-12-27 NOTE — Telephone Encounter (Signed)
Left message for patient to return our call.  Okay for Rx Hydromet and prednisone.  If no improvement patient should schedule an office visit.

## 2015-12-27 NOTE — Telephone Encounter (Signed)
Left detailed message on machine for patient.  Rx for hydromet ready to pick up.  Patient to call back if she would like a prescription for prednisone.

## 2015-12-29 ENCOUNTER — Telehealth: Payer: Self-pay | Admitting: Family Medicine

## 2015-12-29 MED ORDER — PREDNISONE 20 MG PO TABS: 20.0000 mg | ORAL_TABLET | Freq: Every day | ORAL | Status: DC

## 2015-12-29 NOTE — Telephone Encounter (Signed)
Pt states the HYDROcodone-homatropine (HYCODAN) 5-1.5 MG/5ML syrup Made her itch and a hot feeling all over.  Pt called the all night pharmacy and they advised her to take a   benedryl and not take any more of the cough syrup. Pt would like to know if you would go ahead and call in the prednisone instead?  [facility]

## 2015-12-29 NOTE — Telephone Encounter (Signed)
Rx for prednisone has been sent.  Please tell the patient to take the prednisone as a taper.  If she needs the directions just let me know.  thanks

## 2015-12-29 NOTE — Telephone Encounter (Signed)
Pt does need instructions

## 2015-12-29 NOTE — Telephone Encounter (Signed)
Spoke with patient and reviewed taper for prednisone

## 2016-02-26 ENCOUNTER — Ambulatory Visit (INDEPENDENT_AMBULATORY_CARE_PROVIDER_SITE_OTHER): Payer: 59 | Admitting: *Deleted

## 2016-02-26 DIAGNOSIS — Z23 Encounter for immunization: Secondary | ICD-10-CM | POA: Diagnosis not present

## 2016-03-19 ENCOUNTER — Other Ambulatory Visit (INDEPENDENT_AMBULATORY_CARE_PROVIDER_SITE_OTHER): Payer: 59

## 2016-03-19 DIAGNOSIS — Z0000 Encounter for general adult medical examination without abnormal findings: Secondary | ICD-10-CM

## 2016-03-19 DIAGNOSIS — Z1159 Encounter for screening for other viral diseases: Secondary | ICD-10-CM

## 2016-03-19 LAB — BASIC METABOLIC PANEL
BUN: 19 mg/dL (ref 6–23)
CO2: 27 mEq/L (ref 19–32)
Calcium: 8.9 mg/dL (ref 8.4–10.5)
Chloride: 106 mEq/L (ref 96–112)
Creatinine, Ser: 0.73 mg/dL (ref 0.40–1.20)
GFR: 86.22 mL/min (ref >60.00)
Glucose, Bld: 93 mg/dL (ref 70–99)
POTASSIUM: 4.1 meq/L (ref 3.5–5.1)
SODIUM: 138 meq/L (ref 135–145)

## 2016-03-19 LAB — POC URINALSYSI DIPSTICK (AUTOMATED)
Bilirubin, UA: NEGATIVE
Glucose, UA: NEGATIVE
Ketones, UA: NEGATIVE
Leukocytes, UA: NEGATIVE
Nitrite, UA: NEGATIVE
PROTEIN UA: NEGATIVE
Spec Grav, UA: 1.025
UROBILINOGEN UA: 0.2
pH, UA: 5.5

## 2016-03-19 LAB — TSH: TSH: 0.62 u[IU]/mL (ref 0.35–4.50)

## 2016-03-19 LAB — CBC WITH DIFFERENTIAL/PLATELET
BASOS ABS: 0 10*3/uL (ref 0.0–0.1)
Basophils Relative: 0.7 % (ref 0.0–3.0)
EOS PCT: 2.6 % (ref 0.0–5.0)
Eosinophils Absolute: 0.1 10*3/uL (ref 0.0–0.7)
HCT: 39.8 % (ref 36.0–46.0)
HEMOGLOBIN: 13.7 g/dL (ref 12.0–15.0)
LYMPHS PCT: 38.3 % (ref 12.0–46.0)
Lymphs Abs: 1.3 10*3/uL (ref 0.7–4.0)
MCHC: 34.5 g/dL (ref 30.0–36.0)
MCV: 88.9 fl (ref 78.0–100.0)
MONOS PCT: 7.1 % (ref 3.0–12.0)
Monocytes Absolute: 0.2 10*3/uL (ref 0.1–1.0)
Neutro Abs: 1.8 10*3/uL (ref 1.4–7.7)
Neutrophils Relative %: 51.3 % (ref 43.0–77.0)
PLATELETS: 195 10*3/uL (ref 150.0–400.0)
RBC: 4.47 Mil/uL (ref 3.87–5.11)
RDW: 13.1 % (ref 11.5–15.5)
WBC: 3.5 10*3/uL — ABNORMAL LOW (ref 4.0–10.5)

## 2016-03-19 LAB — HEPATIC FUNCTION PANEL
ALK PHOS: 60 U/L (ref 39–117)
ALT: 18 U/L (ref 0–35)
AST: 21 U/L (ref 0–37)
Albumin: 4.1 g/dL (ref 3.5–5.2)
BILIRUBIN DIRECT: 0.1 mg/dL (ref 0.0–0.3)
Total Bilirubin: 0.4 mg/dL (ref 0.2–1.2)
Total Protein: 6.5 g/dL (ref 6.0–8.3)

## 2016-03-19 LAB — LIPID PANEL
CHOLESTEROL: 195 mg/dL (ref 0–200)
HDL: 52.7 mg/dL (ref >39.00)
LDL Cholesterol: 113 mg/dL — ABNORMAL HIGH (ref 0–99)
NonHDL: 142.09
TRIGLYCERIDES: 144 mg/dL (ref 0.0–149.0)
Total CHOL/HDL Ratio: 4
VLDL: 28.8 mg/dL (ref 0.0–40.0)

## 2016-03-20 LAB — HEPATITIS C ANTIBODY: HCV Ab: NEGATIVE

## 2016-03-26 ENCOUNTER — Encounter: Payer: 59 | Admitting: Family Medicine

## 2016-03-26 ENCOUNTER — Encounter: Payer: Self-pay | Admitting: Family Medicine

## 2016-03-26 ENCOUNTER — Ambulatory Visit (INDEPENDENT_AMBULATORY_CARE_PROVIDER_SITE_OTHER): Payer: 59 | Admitting: Family Medicine

## 2016-03-26 ENCOUNTER — Telehealth: Payer: Self-pay | Admitting: Family Medicine

## 2016-03-26 VITALS — BP 134/76 | HR 85 | Temp 98.2°F | Resp 12 | Ht 63.25 in | Wt 144.3 lb

## 2016-03-26 DIAGNOSIS — R3129 Other microscopic hematuria: Secondary | ICD-10-CM | POA: Diagnosis not present

## 2016-03-26 DIAGNOSIS — J4520 Mild intermittent asthma, uncomplicated: Secondary | ICD-10-CM | POA: Diagnosis not present

## 2016-03-26 DIAGNOSIS — E785 Hyperlipidemia, unspecified: Secondary | ICD-10-CM

## 2016-03-26 DIAGNOSIS — Z0000 Encounter for general adult medical examination without abnormal findings: Secondary | ICD-10-CM | POA: Diagnosis not present

## 2016-03-26 DIAGNOSIS — E559 Vitamin D deficiency, unspecified: Secondary | ICD-10-CM

## 2016-03-26 DIAGNOSIS — Z23 Encounter for immunization: Secondary | ICD-10-CM

## 2016-03-26 LAB — VITAMIN D 25 HYDROXY (VIT D DEFICIENCY, FRACTURES): VITD: 22.16 ng/mL — ABNORMAL LOW (ref 30.00–100.00)

## 2016-03-26 LAB — URINALYSIS, MICROSCOPIC ONLY
RBC / HPF: NONE SEEN (ref 0–?)
WBC, UA: NONE SEEN (ref 0–?)

## 2016-03-26 NOTE — Progress Notes (Signed)
Subjective:    Patient ID: Hannah [name], female    DOB: 08-Mar-1956, 60 y.o.   MRN: [id number]  HPI   Ms. Hannah [name] is a 60 y.o. female, who is here today to establish care with me and for her routine physical. She is former Dr Honor Junes pt. She had lab work recently.  She does exercise regularly and following a healthier diet that she did a year ago, still snacking when stress.  She lives with husband.  She has hx of HLD, asthma,allergic rhinitis, and vit D deficiency.  She is on Pravastatin 40 mg daily and Vit D 400 U OTC She needs albuterol inhaler about once or twice per year.  She follows with gyn for her routine preventive care.   FHx for gynecologic or colon cancer negative.  She has no major concerns today, wonders if she could stop statin, has read about some side effects, has not noted any. Mentions fatigue, which seems chronic, on problem list, attributed to stress for the past year and dealing with her brothers after her mother died about a year ago. She would like Vit D check today.  She follows with dermatologist and ortho periodically. No hearing loss.     Lipid Panel     Component Value Date/Time   CHOL 195 03/19/2016 0817   TRIG 144.0 03/19/2016 0817   HDL 52.70 03/19/2016 0817   CHOLHDL 4 03/19/2016 0817   VLDL 28.8 03/19/2016 0817   LDLCALC 113* 03/19/2016 0817   LDLDIRECT 183.0 07/24/2015 0808    Lab Results  Component Value Date   CREATININE 0.73 03/19/2016   BUN 19 03/19/2016   NA 138 03/19/2016   K 4.1 03/19/2016   CL 106 03/19/2016   CO2 27 03/19/2016   Lab Results  Component Value Date   WBC 3.5* 03/19/2016   HGB 13.7 03/19/2016   HCT 39.8 03/19/2016   MCV 88.9 03/19/2016   PLT 195.0 03/19/2016   Recent labs included U/A, which showed 2+ blood.She denies any gross hematuria or hx of nephrolithiasis. Family history negative for bladder/renal cancer, no history of tobacco use.  Urinalysis    Component Value  Date/Time   COLORURINE yellow 03/07/2010 0827   APPEARANCEUR Clear 03/07/2010 0827   LABSPEC >=1.030 03/07/2010 0827   PHURINE 5.0 03/07/2010 0827   HGBUR trace-lysed 03/07/2010 0827   BILIRUBINUR n 03/19/2016 0935   BILIRUBINUR small 03/07/2010 0827   PROTEINUR n 03/19/2016 0935   UROBILINOGEN 0.2 03/19/2016 0935   UROBILINOGEN 0.2 03/07/2010 0827   NITRITE n 03/19/2016 0935   NITRITE negative 03/07/2010 0827   LEUKOCYTESUR Negative 03/19/2016 0935    Review of Systems  Constitutional: Positive for fatigue (no more than usual ). Negative for fever and appetite change.  HENT: Negative for hearing loss, mouth sores, trouble swallowing and voice change.   Eyes: Negative for photophobia and visual disturbance.  Respiratory: Negative for cough, shortness of breath and wheezing.   Cardiovascular: Negative for chest pain and leg swelling.  Gastrointestinal: Negative for nausea, vomiting and abdominal pain.       No changes in bowel habits.  Endocrine: Negative for cold intolerance and heat intolerance.  Genitourinary: Negative for dysuria, hematuria, decreased urine volume, vaginal bleeding and vaginal discharge.  Musculoskeletal: Positive for arthralgias. Negative for back pain, gait problem and neck pain.  Skin: Negative for color change and rash.  Neurological: Negative for seizures, syncope, weakness, numbness and headaches.  Hematological: Bruises/bleeds easily (no more than usual with  minor trauma).  Psychiatric/Behavioral: Negative for confusion and sleep disturbance. The patient is not nervous/anxious.        + stress   All other systems reviewed and are negative.    Current Outpatient Prescriptions on File Prior to Visit  Medication Sig Dispense Refill  . albuterol (PROVENTIL HFA;VENTOLIN HFA) 108 (90 Base) MCG/ACT inhaler Inhale 2 puffs into the lungs every 6 (six) hours as needed for wheezing or shortness of breath. 1 Inhaler 3   No current facility-administered  medications on file prior to visit.     Past Medical History  Diagnosis Date  . Hyperlipidemia   . Osteoporosis   . Asthma   . Allergic rhinitis    Family History  Problem Relation Age of Onset  . Stroke Mother   . Alcohol abuse Father   . Heart disease Father   . Heart disease Brother 32  . Drug abuse Brother   . Hyperlipidemia Other     Social History   Social History  . Marital Status: Married    Spouse Name: N/A  . Number of Children: N/A  . Years of Education: N/A   Social History Main Topics  . Smoking status: Never Smoker   . Smokeless tobacco: Never Used  . Alcohol Use: Yes     Comment: twice a month  . Drug Use: No  . Sexual Activity: Not Asked   Other Topics Concern  . None   Social History Narrative    Filed Vitals:   03/26/16 1359  BP: 134/76  Pulse: 85  Temp: 98.2 F (36.8 C)  Resp: 12   Body mass index is 25.35 kg/(m^2). SpO2 Readings from Last 3 Encounters:  06/10/12 98%  05/22/12 95%        Objective:   Physical Exam  Constitutional: She is oriented to person, place, and time. She appears well-developed and well-nourished. No distress.  HENT:  Right Ear: Hearing, tympanic membrane and external ear normal.  Left Ear: Hearing, tympanic membrane, external ear and ear canal normal.  Mouth/Throat: Uvula is midline, oropharynx is clear and moist and mucous membranes are normal. No oral lesions.  Eyes: Conjunctivae and EOM are normal. Pupils are equal, round, and reactive to light. Right eye exhibits no discharge. Left eye exhibits no discharge.  Neck: No thyroid mass and no thyromegaly present.  Cardiovascular: Normal rate, regular rhythm, normal heart sounds and normal pulses.   No murmur heard. Pulses:      Radial pulses are 2+ on the right side, and 2+ on the left side.       Dorsalis pedis pulses are 2+ on the right side, and 2+ on the left side.  Pulmonary/Chest: Effort normal and breath sounds normal.  Abdominal: Normal  appearance. There is no hepatosplenomegaly. There is no tenderness. No hernia.  Genitourinary:  Deferred to gyn  Musculoskeletal:  No major deformity appreciated or signs of synovitis.  Lymphadenopathy:    She has no cervical adenopathy.       Right: No supraclavicular adenopathy present.       Left: No supraclavicular adenopathy present.  Neurological: She is alert and oriented to person, place, and time. She has normal strength. No cranial nerve deficit or sensory deficit.  Reflex Scores:      Bicep reflexes are 2+ on the right side and 2+ on the left side.      Patellar reflexes are 2+ on the right side and 2+ on the left side. Normal gait with no assistance  needed.  Skin: Skin is warm. No rash noted. No erythema.  No suspicious lesions or erythematous rash appreciated. Pigmentation changes related to chronic sun exposure.  Psychiatric: She has a normal mood and affect. Her behavior is normal.  Nursing note and vitals reviewed.      Assessment & Plan:    Emaline was seen today for annual exam.  Diagnoses and all orders for this visit:  Routine general medical examination at a health care facility   We discussed the importance of regular physical activity and healthy diet for prevention of chronic illness and/or complications. Preventive guidelines reviewed. Continue following with gyn for routine preventive female care. Some general recommendations about vaccination, because Hx of asthma, Pneumovax recommended. Ca++ 1200 mg daily, ideally through diet, recommended. Vit D [phone] U daily. Sun screen and avoidance of direct sun light strongly recommended.  Recent lab results reviewed.  Next CPE in 1 year.   Asthma, mild intermittent, uncomplicated Stable. No changes in current management. F/U in 12 months.  -     Pneumococcal polysaccharide vaccine 23-valent greater than or equal to 2yo subcutaneous/IM  Hyperlipidemia  She would like to try nonpharmacologic  treatment, so she will stop starting and have fasting lipid panel in 5 months.  -     Lipid panel; Future  Microscopic hematuria  She will have a microscopic urine done today, further recommendations will be given accordingly.  -     Urine Microscopic Only  Vitamin D deficiency No changes in current management, will follow labs done today and will give further recommendations accordingly.  -     VITAMIN D 25 Hydroxy (Vit-D Deficiency, Fractures)      Follow up in a year, before if a new concern arise.     Yony Roulston G. Martin[place], MD  [facility]. [place] office.

## 2016-03-26 NOTE — Patient Instructions (Signed)
A few things to remember from today's visit:   1. Routine general medical examination at a health care facility   2. Asthma, mild intermittent, uncomplicated   3. Hyperlipidemia  Try non pharmacologic treatment. Lab in 5 months.  4. Microscopic hematuria  - Urine Microscopic Only    - Healthy diet low in red meet and animal fat, high in vegetables and fruit + regular physical activity.  - Vaccines:  Tdap vaccine every 10 years.  Shingles vaccine recommended at age 21, could be given after 61 years of age but not sure about insurance coverage.  Pneumonia vaccines:  Prevnar 13 at 65 and Pneumovax at 50.  Screening recommendations for low/normal risk women:  Screening for diabetes at age 23-45 and every 3 years.  Cervical cancer prevention:   -Pap smear starts at 61 years of age and continues periodically until 61 years old in low risk women.    -Breast cancer: Mammogram: There is disagreement between experts about when to start screening in low risk asymptomatic female (40-45-50 years). > 28 years old every 2 years.   Colon cancer screening: starts at 61 years old until 61 years old.  Cholesterol disorder screening at age 26 and every 3 years.      If you sign-up for My chart, you can communicate easier with [place] in case you have any question or concern.

## 2016-03-26 NOTE — Telephone Encounter (Signed)
Dr. Martinique plans to put them in later.

## 2016-03-26 NOTE — Telephone Encounter (Signed)
Pt is to have labs done in 5 months. Can you put the orders in?

## 2016-03-27 ENCOUNTER — Telehealth: Payer: Self-pay

## 2016-03-27 NOTE — Telephone Encounter (Signed)
Called patient. Left detailed message per DPR. 

## 2016-03-27 NOTE — Telephone Encounter (Signed)
-----   Message from Betty G Martinique, MD sent at 03/27/2016  1:13 PM EDT ----- Vit D mildly low. OTC Vit D3 2000 U daily recommended. Urine test no blood, negative. Thanks!

## 2016-08-20 ENCOUNTER — Telehealth: Payer: Self-pay | Admitting: Family Medicine

## 2016-08-20 ENCOUNTER — Other Ambulatory Visit: Payer: Self-pay

## 2016-08-20 ENCOUNTER — Other Ambulatory Visit (INDEPENDENT_AMBULATORY_CARE_PROVIDER_SITE_OTHER): Payer: 59

## 2016-08-20 DIAGNOSIS — E785 Hyperlipidemia, unspecified: Secondary | ICD-10-CM

## 2016-08-20 LAB — LIPID PANEL
Cholesterol: 266 mg/dL — ABNORMAL HIGH (ref 0–200)
HDL: 47.5 mg/dL (ref >39.00)
LDL CALC: 188 mg/dL — AB (ref 0–99)
NonHDL: 218.92
Total CHOL/HDL Ratio: 6
Triglycerides: 155 mg/dL — ABNORMAL HIGH (ref 0.0–149.0)
VLDL: 31 mg/dL (ref 0.0–40.0)

## 2016-08-20 MED ORDER — PRAVASTATIN SODIUM 40 MG PO TABS: 40.0000 mg | ORAL_TABLET | Freq: Every day | ORAL | 2 refills | Status: DC

## 2016-08-20 NOTE — Telephone Encounter (Signed)
Patient aware of cholesterol levels and verbalized understanding.

## 2016-08-20 NOTE — Telephone Encounter (Signed)
Pt would like to have her lab result (want to know the cholesterol levels)

## 2016-10-28 HISTORY — PX: FOOT SURGERY: SHX648

## 2017-03-19 ENCOUNTER — Other Ambulatory Visit: Payer: 59

## 2017-03-24 NOTE — Progress Notes (Signed)
HPI:   Hannah [name] is a 61 y.o. female, who is here today for her routine physical and to follow on some of her chronic medical problems.   Last CPE 03/26/16.  Regular exercise 3 or more time per week: Yes Following a healthy diet: yes She lives with husband.  Chronic medical problems: HLD, allergic rhinitis,asthma, and vit D deficiency among some.   She follows with gyn for her gyn preventive care. She is also following with ophthalmologists, had spontaneous left vitreous hemorrhage, unknown etiology.   Mammogram: Per pt report, 03/2016. She has appt 03/2017. Since her last OV she had left breast Bx and marker placed, Bx reported as benign. Colonoscopy: 2014, she is not sure when she is due for next one.She states that diverticulosis and polyp were found.   Immunization History  Administered Date(s) Administered  . Influenza Split 07/28/2013  . Influenza Whole 08/11/2007, 07/28/2009  . Influenza-Unspecified 07/28/2014  . Pneumococcal Polysaccharide-23 03/26/2016  . Td 08/26/2005  . Tdap 02/26/2016  . Zoster 03/06/2011    Hep C screening: 02/2016 negative.  Concerns today:  -Left foot pain, "excruciating" , aggravated by increased intensity exercise a few months ago. According to pt, she is planning on having surgery with general anesthesia. She follows with ortho.  -"Heart fluttering", Reporting HR that goes from 65 to 90 suddenly,she can "feel it."  She has had episodes of palpitations for a couple months but for the past month this has has happened "a lot." It happens at rest when she is home, it seems to be exacerbated by turning to her left in bed, lasts usually a few minutes and resolves but last one lasted 30 min on and off. Some chest tightness associated, feeling like she needed to burp. She denies heartburn.  She denies any symptom with exercise or exertion. She states that she was following with cardiologists until a couple years ago.  -She  is also concerned about "thyroid". According to pt, she was at her dentists' office about 2 weeks ago and she was instructed to have "thyroid" check because it was enlarged, she is pointing to her left submandibular area. Dry mouth for the past 2-3 weeks.   Lab Results  Component Value Date   TSH 0.62 03/19/2016   Today she has had some tenderness right submandibular but she has not noted fever,night sweats, or abnormal wt loss. She denies tobacco use.She has not noted oral lesions, dysphagia,or odynophagia.  Vit D deficiency: -She is not sure about how much vit D supplementation she is taking, Ca++ + D tab daily.   Hyperlipidemia:  Currently on Pravastatin 40 mg daily Following a low fat diet: Yes..  She has not noted side effects with medication.  Lab Results  Component Value Date   CHOL 266 (H) 08/20/2016   HDL 47.50 08/20/2016   LDLCALC 188 (H) 08/20/2016   LDLDIRECT 183.0 07/24/2015   TRIG 155.0 (H) 08/20/2016   CHOLHDL 6 08/20/2016      Review of Systems  Constitutional: Positive for fatigue. Negative for appetite change, fever and unexpected weight change.  HENT: Negative for dental problem, facial swelling, hearing loss, mouth sores, sore throat, trouble swallowing and voice change.   Eyes: Negative for pain and visual disturbance.  Respiratory: Negative for cough, shortness of breath and wheezing.   Cardiovascular: Positive for palpitations. Negative for chest pain and leg swelling.  Gastrointestinal: Negative for abdominal pain, nausea and vomiting.       No  changes in bowel habits.  Endocrine: Negative for cold intolerance, heat intolerance, polydipsia, polyphagia and polyuria.  Genitourinary: Negative for decreased urine volume, dysuria, hematuria, vaginal bleeding and vaginal discharge.  Musculoskeletal: Positive for arthralgias. Negative for gait problem and neck pain.  Skin: Negative for color change and rash.  Neurological: Negative for syncope, weakness,  numbness and headaches.  Hematological: Negative for adenopathy. Does not bruise/bleed easily.  Psychiatric/Behavioral: Positive for sleep disturbance. Negative for confusion. The patient is nervous/anxious.   All other systems reviewed and are negative.    Current Outpatient Prescriptions on File Prior to Visit  Medication Sig Dispense Refill  . albuterol (PROVENTIL HFA;VENTOLIN HFA) 108 (90 Base) MCG/ACT inhaler Inhale 2 puffs into the lungs every 6 (six) hours as needed for wheezing or shortness of breath. 1 Inhaler 3  . pravastatin (PRAVACHOL) 40 MG tablet Take 1 tablet (40 mg total) by mouth daily. 90 tablet 2   No current facility-administered medications on file prior to visit.      Past Medical History:  Diagnosis Date  . Allergic rhinitis   . Asthma   . Hyperlipidemia   . Osteoporosis     Allergies  Allergen Reactions  . Aspirin     REACTION: bruises very easily  . Codeine    Past Surgical History:  Procedure Laterality Date  . CHOLECYSTECTOMY    . WISDOM TOOTH EXTRACTION      Family History  Problem Relation Age of Onset  . Stroke Mother   . Kidney disease Mother   . Diabetes Mother   . Alcohol abuse Father   . Heart disease Father   . Heart disease Brother 21  . Drug abuse Brother   . Hyperlipidemia Other     Social History   Social History  . Marital status: Married    Spouse name: N/A  . Number of children: N/A  . Years of education: N/A   Social History Main Topics  . Smoking status: Never Smoker  . Smokeless tobacco: Never Used  . Alcohol use Yes     Comment: twice a month  . Drug use: No  . Sexual activity: Not Asked   Other Topics Concern  . None   Social History Narrative  . None    Vitals:   03/25/17 1351  BP: 132/72  Pulse: 88  Resp: 12   Body mass index is 24.32 kg/m.  O2 sat at RA 95%  Wt Readings from Last 3 Encounters:  03/25/17 138 lb 6 oz (62.8 kg)  03/26/16 144 lb 4.8 oz (65.5 kg)  02/06/15 141 lb 9.6 oz  (64.2 kg)    Physical Exam  Constitutional: She is oriented to person, place, and time. She appears well-developed and well-nourished. No distress.  HENT:  Head: Atraumatic.  Right Ear: Hearing, tympanic membrane, external ear and ear canal normal.  Left Ear: Hearing, tympanic membrane, external ear and ear canal normal.  Mouth/Throat: Uvula is midline, oropharynx is clear and moist and mucous membranes are normal.  Eyes: Conjunctivae and EOM are normal. Pupils are equal, round, and reactive to light.  Neck: No tracheal deviation present. No thyroid mass and no thyromegaly present.  Cardiovascular: Normal rate and regular rhythm.   No murmur heard. Pulses:      Dorsalis pedis pulses are 2+ on the right side, and 2+ on the left side.  Respiratory: Effort normal and breath sounds normal. No respiratory distress.  GI: Soft. She exhibits no mass. There is no hepatomegaly. There is  no tenderness.  Musculoskeletal: She exhibits no edema.  No major deformity or signs of synovitis appreciated.  Lymphadenopathy:       Head (right side): Submandibular adenopathy present.       Head (left side): No submandibular adenopathy present.    She has no cervical adenopathy.       Right: No supraclavicular adenopathy present.       Left: No supraclavicular adenopathy present.  Right submandibular gland mildly tender and enlarged.  Neurological: She is alert and oriented to person, place, and time. She has normal strength. No cranial nerve deficit. Coordination and gait normal.  Reflex Scores:      Bicep reflexes are 2+ on the right side and 2+ on the left side.      Patellar reflexes are 2+ on the right side and 2+ on the left side. Skin: Skin is warm. No rash noted. No erythema.  Psychiatric: Her speech is normal. Her mood appears anxious. Cognition and memory are normal.  Well groomed, good eye contact.     ASSESSMENT AND PLAN:   Makennah was seen today for annual exam.  Diagnoses and all  orders for this visit:  Routine general medical examination at a health care facility  We discussed the importance of regular physical activity and healthy diet for prevention of chronic illness and/or complications. Preventive guidelines reviewed.She will continue following with gyn for her gyn preventive care.  Vaccination up to date. She is interested in the new zoster vaccine, so Rx for Shingrix given. Allergic to Aspirin. Ca++ and vit D supplementation recommended to continue. Next CPE in 1 year.   -     Zoster Vac Recomb Adjuvanted [facility]) injection; Inject 0.5 mLs into the muscle every 3 (three) months.  -     Basic metabolic panel; Future  Submandibular gland swelling  She is not sure for how long it has been enlarged but it seems like this was the abnormality noted by her dentists 03/12/17. We discussed possible etiologies. Neck CT was recommended.  -     CT Soft Tissue Neck W Contrast; Future  Vitamin D deficiency  Maintenance dose 800 U daily at least, we will follow lab and will give further recommendations accordingly.  -     VITAMIN D 25 Hydroxy (Vit-D Deficiency, Fractures); Future  Hyperlipidemia, unspecified hyperlipidemia type  No changes in current management, will follow labs and will give further recommendations accordingly.  -     Lipid panel; Future  Heart palpitations  CV examination today normal otherwise. Hx does not seem concerning for serious cardiac arrhythmia. She is concerned because she is having foot surgery in a few weeks, afraid of complications during anesthesia.  She has a cardiologists, not sure if referral is needed, she will let me know. Instructed about warning signs.  -     TSH; Future -     CBC; Future -     Ambulatory referral to Cardiology  Diabetes mellitus screening  -     Basic metabolic panel; Future   Return in 1 year (on 03/25/2018).    Betty G. Martinique, MD  [facility]. [place]  office.

## 2017-03-25 ENCOUNTER — Encounter: Payer: Self-pay | Admitting: Family Medicine

## 2017-03-25 ENCOUNTER — Ambulatory Visit (INDEPENDENT_AMBULATORY_CARE_PROVIDER_SITE_OTHER): Payer: 59 | Admitting: Family Medicine

## 2017-03-25 ENCOUNTER — Telehealth: Payer: Self-pay | Admitting: Family Medicine

## 2017-03-25 VITALS — BP 132/72 | HR 88 | Resp 12 | Ht 63.25 in | Wt 138.4 lb

## 2017-03-25 DIAGNOSIS — R609 Edema, unspecified: Secondary | ICD-10-CM

## 2017-03-25 DIAGNOSIS — Z131 Encounter for screening for diabetes mellitus: Secondary | ICD-10-CM | POA: Diagnosis not present

## 2017-03-25 DIAGNOSIS — R002 Palpitations: Secondary | ICD-10-CM | POA: Diagnosis not present

## 2017-03-25 DIAGNOSIS — Z0000 Encounter for general adult medical examination without abnormal findings: Secondary | ICD-10-CM

## 2017-03-25 DIAGNOSIS — E559 Vitamin D deficiency, unspecified: Secondary | ICD-10-CM

## 2017-03-25 DIAGNOSIS — R600 Localized edema: Secondary | ICD-10-CM

## 2017-03-25 DIAGNOSIS — E785 Hyperlipidemia, unspecified: Secondary | ICD-10-CM

## 2017-03-25 MED ORDER — ZOSTER VAC RECOMB ADJUVANTED 50 MCG/0.5ML IM SUSR: 0.5000 mL | INTRAMUSCULAR | 1 refills | Status: AC

## 2017-03-25 NOTE — Telephone Encounter (Signed)
Pt states she needs a referral to [facility]. Pt states sr Martinique advised her to see them Pt states if dr Martinique will put in referral today, they can work her in tomorrow because she is going out of town Friday, then having surgery.  Thank you.

## 2017-03-25 NOTE — Patient Instructions (Addendum)
A few things to remember from today's visit:   Submandibular gland swelling - Plan: CT Soft Tissue Neck W Contrast  Vitamin D deficiency  Hyperlipidemia, unspecified hyperlipidemia type  Labs tomorrow. Cardiology appt to address palpitation sine you are having surgery.   At least 150 minutes of moderate exercise per week, daily brisk walking for 15-30 min is a good exercise option. Healthy diet low in saturated (animal) fats and sweets and consisting of fresh fruits and vegetables, lean meats such as fish and white chicken and whole grains.   - Vaccines:  Tdap vaccine every 10 years.  Shingles vaccine recommended at age 62, could be given after 62 years of age but not sure about insurance coverage.  Pneumonia vaccines:  Prevnar 13 at 65 and Pneumovax at 45.  Screening recommendations for low/normal risk women:  Screening for diabetes at age 36-45 and every 3 years.  Cervical cancer prevention:  -HPV vaccination between 51-70 years old. -Pap smear starts at 62 years of age and continues periodically until 62 years old in low risk women. Pap smear every 3 years between 73 and 31 years old. Pap smear every 3 years between women 27 and older if pap smear negative and HPV screening negative.   -Breast cancer: Mammogram: There is disagreement between experts about when to start screening in low risk asymptomatic female but recent recommendations are to start screening at 82 and not later than 62 years old , every 1-2 years and after 62 yo q 2 years. Screening is recommended until 62 years old but some women can continue screening depending of healthy issues.   Colon cancer screening: starts at 62 years old until 62 years old.  Cholesterol disorder screening at age 33 and every 3 years.  Also recommended:  1. Dental visit- Brush and floss your teeth twice daily; visit your dentist twice a year. 2. Eye doctor- Get an eye exam at least every 2 years. 3. Helmet use- Always wear a  helmet when riding a bicycle, motorcycle, rollerblading or skateboarding. 4. Safe sex- If you may be exposed to sexually transmitted infections, use a condom. 5. Seat belts- Seat belts can save your live; always wear one. 6. Smoke/Carbon Monoxide detectors- These detectors need to be installed on the appropriate level of your home. Replace batteries at least once a year. 7. Skin cancer- When out in the sun please cover up and use sunscreen 15 SPF or higher. 8. Violence- If anyone is threatening or hurting you, please tell your healthcare provider.  9. Drink alcohol in moderation- Limit alcohol intake to one drink or less per day. Never drink and drive.  Please be sure medication list is accurate. If a new problem present, please set up appointment sooner than planned today.

## 2017-03-25 NOTE — Telephone Encounter (Signed)
Please place referral.  - the orders screen is locked & won't let me put the order in.

## 2017-03-25 NOTE — Telephone Encounter (Signed)
Referral placed. Thanks,

## 2017-03-26 ENCOUNTER — Other Ambulatory Visit (INDEPENDENT_AMBULATORY_CARE_PROVIDER_SITE_OTHER): Payer: 59

## 2017-03-26 DIAGNOSIS — Z0000 Encounter for general adult medical examination without abnormal findings: Secondary | ICD-10-CM | POA: Diagnosis not present

## 2017-03-26 DIAGNOSIS — R002 Palpitations: Secondary | ICD-10-CM | POA: Diagnosis not present

## 2017-03-26 DIAGNOSIS — Z131 Encounter for screening for diabetes mellitus: Secondary | ICD-10-CM

## 2017-03-26 DIAGNOSIS — E785 Hyperlipidemia, unspecified: Secondary | ICD-10-CM | POA: Diagnosis not present

## 2017-03-26 DIAGNOSIS — E559 Vitamin D deficiency, unspecified: Secondary | ICD-10-CM | POA: Diagnosis not present

## 2017-03-26 LAB — BASIC METABOLIC PANEL
BUN: 18 mg/dL (ref 6–23)
CALCIUM: 9 mg/dL (ref 8.4–10.5)
CHLORIDE: 105 meq/L (ref 96–112)
CO2: 29 meq/L (ref 19–32)
Creatinine, Ser: 0.76 mg/dL (ref 0.40–1.20)
GFR: 82.03 mL/min (ref >60.00)
GLUCOSE: 108 mg/dL — AB (ref 70–99)
POTASSIUM: 4.1 meq/L (ref 3.5–5.1)
SODIUM: 140 meq/L (ref 135–145)

## 2017-03-26 LAB — CBC
HEMATOCRIT: 40.9 % (ref 36.0–46.0)
HEMOGLOBIN: 14 g/dL (ref 12.0–15.0)
MCHC: 34.2 g/dL (ref 30.0–36.0)
MCV: 89.8 fl (ref 78.0–100.0)
PLATELETS: 190 10*3/uL (ref 150.0–400.0)
RBC: 4.55 Mil/uL (ref 3.87–5.11)
RDW: 13 % (ref 11.5–15.5)
WBC: 3.8 10*3/uL — ABNORMAL LOW (ref 4.0–10.5)

## 2017-03-26 LAB — LIPID PANEL
CHOL/HDL RATIO: 4
Cholesterol: 217 mg/dL — ABNORMAL HIGH (ref 0–200)
HDL: 52.8 mg/dL (ref >39.00)
LDL Cholesterol: 134 mg/dL — ABNORMAL HIGH (ref 0–99)
NONHDL: 164.49
TRIGLYCERIDES: 150 mg/dL — AB (ref 0.0–149.0)
VLDL: 30 mg/dL (ref 0.0–40.0)

## 2017-03-26 LAB — TSH: TSH: 0.56 u[IU]/mL (ref 0.35–4.50)

## 2017-03-26 LAB — VITAMIN D 25 HYDROXY (VIT D DEFICIENCY, FRACTURES): VITD: 24.29 ng/mL — AB (ref 30.00–100.00)

## 2017-03-27 ENCOUNTER — Encounter: Payer: Self-pay | Admitting: Family Medicine

## 2017-03-28 ENCOUNTER — Encounter: Payer: Self-pay | Admitting: Family Medicine

## 2017-03-31 ENCOUNTER — Encounter: Payer: Self-pay | Admitting: Family Medicine

## 2017-03-31 ENCOUNTER — Ambulatory Visit (INDEPENDENT_AMBULATORY_CARE_PROVIDER_SITE_OTHER)
Admission: RE | Admit: 2017-03-31 | Discharge: 2017-03-31 | Disposition: A | Discharge status: Home / Self Care | Payer: 59 | Source: Ambulatory Visit | Attending: Family Medicine | Admitting: Family Medicine

## 2017-03-31 DIAGNOSIS — R609 Edema, unspecified: Secondary | ICD-10-CM

## 2017-03-31 MED ORDER — IOPAMIDOL (ISOVUE-300) INJECTION 61%
75.0000 mL | Freq: Once | INTRAVENOUS | Status: AC | PRN
  Administered 2017-03-31: 75 mL via INTRAVENOUS

## 2017-04-03 ENCOUNTER — Encounter: Payer: Self-pay | Admitting: Cardiovascular Disease

## 2017-04-03 ENCOUNTER — Telehealth: Payer: Self-pay | Admitting: *Deleted

## 2017-04-03 ENCOUNTER — Ambulatory Visit (INDEPENDENT_AMBULATORY_CARE_PROVIDER_SITE_OTHER): Payer: 59 | Admitting: Cardiovascular Disease

## 2017-04-03 VITALS — BP 122/72 | HR 77 | Ht 63.5 in | Wt 140.4 lb

## 2017-04-03 DIAGNOSIS — R002 Palpitations: Secondary | ICD-10-CM

## 2017-04-03 DIAGNOSIS — Z79899 Other long term (current) drug therapy: Secondary | ICD-10-CM

## 2017-04-03 DIAGNOSIS — I6523 Occlusion and stenosis of bilateral carotid arteries: Secondary | ICD-10-CM

## 2017-04-03 DIAGNOSIS — E785 Hyperlipidemia, unspecified: Secondary | ICD-10-CM

## 2017-04-03 DIAGNOSIS — Z01818 Encounter for other preprocedural examination: Secondary | ICD-10-CM

## 2017-04-03 MED ORDER — ROSUVASTATIN CALCIUM 20 MG PO TABS: 20.0000 mg | ORAL_TABLET | Freq: Every day | ORAL | 3 refills | Status: DC

## 2017-04-03 MED ORDER — METOPROLOL TARTRATE 25 MG PO TABS: ORAL_TABLET | ORAL | 6 refills | Status: DC

## 2017-04-03 NOTE — Telephone Encounter (Signed)
Faxed surgical clearance ok for patient to have planned foot surgery scheduled on 04/08/17.

## 2017-04-03 NOTE — Patient Instructions (Addendum)
Medication Instructions:   STOP the pravastatin. This has been replaced with rosuvastatin.  Start metoprolol as directed on the bottle.  Labwork:  c-met and lipid in 2-3 months    Your doctor has ordered you to have blood work. You may go to any lab you choose, however there is a [facility] lab located [place]. Check in with the front office staff. NO APPOINTMENT IS NEED. However they are usually gone between the hours of 1-2 for lunch. The Nurse/Medical Assistant will give you instructions on the need to fast.  Testing/Procedures:  Your physician has requested that you have an echocardiogram tomorrow or Monday 04/07/17  Echocardiography is a painless test that uses sound waves to create images of your heart. It provides your doctor with information about the size and shape of your heart and how well your heart's chambers and valves are working. This procedure takes approximately one hour. There are no restrictions for this procedure.  Your physician has recommended that you wear a 2 week  event monitor for palpitations. Event monitors are medical devices that record the heart's electrical activity. Doctors most often [place] these monitors to diagnose arrhythmias. Arrhythmias are problems with the speed or rhythm of the heartbeat. The monitor is a small, portable device. You can wear one while you do your normal daily activities. This is usually used to diagnose what is causing palpitations/syncope (passing out).      Follow-Up:  After test   Any Other Special Instructions Will Be Listed Below (If Applicable).

## 2017-04-03 NOTE — Progress Notes (Signed)
Cardiology Office Note    Date:  04/03/2017   ID:  Hannah [name], DOB [birth year], MRN [id number]  PCP:  Martinique, Betty G, MD  Ortho: Dr. Doran Durand Cardiologist:  Shelva Majestic, MD   Pre-perative assessment for occasional palpitations.  History of Present Illness:  Hannah [name] is a 61 y.o. female  who called the office 2 days ago and is now worked in for evaluation of occasional to rare palpitations prior to planned orthopedic surgery on her toe next week.  Ms. Van denies any known cardiac disease.  However, there is a strong family history for CAD in her mother and father are both deceased.  Remotely, she had been seen by Dr. Mar Daring and had t seen him in 2008.  In January 2011.  She had undergone a carotid study due to a question of a bruit.  This showed minimal plaque bilaterally with narrowing in the 0-39% range.  Strong family history for coronary artery disease and a history of cholesterol elevation, when she saw Dr. Verl Blalock.  He advised initiation of simvastatin for lipid lowering therapy.  She also had been followed by Dr. Stevie Kern, for primary care and Dr. Evette Cristal for GYN.  She now sees Dr. Betty Martinique with Dr. Honor Junes retirement. Most recently, she has been on pravastatin 40 mg daily.  She exercises regularly and denies any exercise-induced chest pain or palpitations.  However, recently she has noted some episodes of palpitations which are short-lived last less than 20 seconds and are not associated with lightheadedness or syncope.  She denies associated shortness of breath or chest tightness.  Her husband was diagnosed with atrial fibrillation recently.  Her youngest son was recently married 2 weeks ago in [place] and there was a lot of preparation for this wedding.  She is scheduled to undergo surgery of her left third toe by Dr. Doran Durand next week and will need general anesthesia.  Since I know her personally, she reached out to me to see if I could see her prior to  her surgery.  I worked her in and she presents today for evaluation.   Past Medical History:  Diagnosis Date  . Allergic rhinitis   . Asthma   . Hyperlipidemia   . Osteoporosis     Past Surgical History:  Procedure Laterality Date  . CHOLECYSTECTOMY    . WISDOM TOOTH EXTRACTION      Current Medications: Outpatient Medications Prior to Visit  Medication Sig Dispense Refill  . albuterol (PROVENTIL HFA;VENTOLIN HFA) 108 (90 Base) MCG/ACT inhaler Inhale 2 puffs into the lungs every 6 (six) hours as needed for wheezing or shortness of breath. 1 Inhaler 3  . Cholecalciferol (VITAMIN D3 PO) Take 4,000 Units by mouth daily.     Marland Kitchen Zoster Vac Recomb Adjuvanted [facility]) injection Inject 0.5 mLs into the muscle every 3 (three) months. 1 each 1  . pravastatin (PRAVACHOL) 40 MG tablet Take 1 tablet (40 mg total) by mouth daily. 90 tablet 2   No facility-administered medications prior to visit.      Allergies:   Aspirin and Codeine   Social History   Social History  . Marital status: Married    Spouse name: N/A  . Number of children: N/A  . Years of education: N/A   Social History Main Topics  . Smoking status: Never Smoker  . Smokeless tobacco: Never Used  . Alcohol use Yes     Comment: twice a month  . Drug use: No  .  Sexual activity: Not Asked   Other Topics Concern  . None   Social History Narrative  . None     Additional social history is notable in that she is married for 36 years.  She has 3 children.  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  She received her degree from [employer].  He does drink alcohol rarely.  She exercises at least 6 days a week for 60 minutes and presently does yoga, bicycling, and weights.  Family History:  The patient's family history includes Alcohol abuse in her father; Diabetes in her mother; Drug abuse in her brother; Heart disease in her father; Heart disease (age of onset: 35) in her brother;  Hyperlipidemia in her other; Kidney disease in her mother; Stroke in her mother.  Her brother suffered a myocardial infarction in his 17s, but had been using drugs at the time.  ROS General: Negative; No fevers, chills, or night sweats;  HEENT: Negative; No changes in vision or hearing, sinus congestion, difficulty swallowing Pulmonary: Negative; No cough, wheezing, shortness of breath, hemoptysis Cardiovascular: see HPI GI: Negative; No nausea, vomiting, diarrhea, or abdominal pain U: Negative; No dysuria, hematuria, or difficulty voiding Musculoskeletal: Negative; no myalgias, joint pain, or weakness Hematologic/Oncology: Negative; no easy bruising, bleeding Endocrine: Negative; no heat/cold intolerance; no diabetes Neuro: Negative; no changes in balance, headaches Skin: Negative; No rashes or skin lesions Psychiatric: Negative; No behavioral problems, depression Sleep: Negative; No snoring, daytime sleepiness, hypersomnolence, bruxism, restless legs, hypnogognic hallucinations, no cataplexy Other comprehensive 14 point system review is negative.   PHYSICAL EXAM:   VS:  BP 122/72   Pulse 77   Ht 5' 3.5" (1.613 m)   Wt 140 lb 6.4 oz (63.7 kg)   BMI 24.48 kg/m     136/78 supine and 138/78 standing  Wt Readings from Last 3 Encounters:  04/03/17 140 lb 6.4 oz (63.7 kg)  03/25/17 138 lb 6 oz (62.8 kg)  03/26/16 144 lb 4.8 oz (65.5 kg)    General: Alert, oriented, no distress.  Skin: normal turgor, no rashes, warm and dry HEENT: Normocephalic, atraumatic. Pupils equal round and reactive to light; sclera anicteric; extraocular muscles intact; Fundi Normal Nose without nasal septal hypertrophy Mouth/Parynx benign; Mallinpatti scale 2 Neck: No JVD, no carotid bruits; normal carotid upstroke Lungs: clear to ausculatation and percussion; no wheezing or rales Chest wall: without tenderness to palpitation Heart: PMI not displaced, RRR, s1 s2 normal, 1/6 systolic murmur, no diastolic  murmur, no rubs, gallops, thrills, or heaves Abdomen: soft, nontender; no hepatosplenomehaly, BS+; abdominal aorta nontender and not dilated by palpation. Back: no CVA tenderness Pulses 2+ Musculoskeletal: full range of motion, normal strength, no joint deformities Extremities: no clubbing cyanosis or edema, Homan's sign negative  Neurologic: grossly nonfocal; Cranial nerves grossly wnl Psychologic: Normal mood and affect   Studies/Labs Reviewed:   EKG:  EKG is ordered today.  ECG (independently read by me): Normal sinus rhythm at 77 bpm.,  Nonspecific T-wave abnormality without  significant ST-T changes.  Normal intervals with a PR interval 146, and QTc interval 396 ms.  Recent Labs: BMP Latest Ref Rng & Units 03/26/2017 03/19/2016 09/13/2013  Glucose 70 - 99 mg/dL 108(H) 93 93  BUN 6 - 23 mg/dL _0 Creatinine 0.40 - 1.20 mg/dL 0.76 0.73 0.7  Sodium 135 - 145 mEq/L 140 138 139  Potassium 3.5 - 5.1 mEq/L 4.1 4.1 4.5  Chloride 96 - 112 mEq/L 105 106  103  CO2 19 - 32 mEq/L _0 Calcium 8.4 - 10.5 mg/dL 9.0 8.9 9.4     Hepatic Function Latest Ref Rng & Units 03/19/2016 01/31/2015 09/13/2013  Total Protein 6.0 - 8.3 g/dL 6.5 6.7 7.0  Albumin 3.5 - 5.2 g/dL 4.1 4.0 4.0  AST 0 - 37 U/L _1 ALT 0 - 35 U/L _2 Alk Phosphatase 39 - 117 U/L 60 82 87  Total Bilirubin 0.2 - 1.2 mg/dL 0.4 0.4 0.5  Bilirubin, Direct 0.0 - 0.3 mg/dL 0.1 0.1 0.0    CBC Latest Ref Rng & Units 03/26/2017 03/19/2016 09/13/2013  WBC 4.0 - 10.5 K/uL 3.8(L) 3.5(L) 4.0(L)  Hemoglobin 12.0 - 15.0 g/dL 14.0 13.7 14.3  Hematocrit 36.0 - 46.0 % 40.9 39.8 41.9  Platelets 150.0 - 400.0 K/uL 190.0 195.0 195.0   Lab Results  Component Value Date   MCV 89.8 03/26/2017   MCV 88.9 03/19/2016   MCV 89.4 09/13/2013   Lab Results  Component Value Date   TSH 0.56 03/26/2017   No results found for: HGBA1C   BNP No results found for: BNP  ProBNP No results found for: PROBNP   Lipid Panel       Component Value Date/Time   CHOL 217 (H) 03/26/2017 0849   TRIG 150.0 (H) 03/26/2017 0849   HDL 52.80 03/26/2017 0849   CHOLHDL 4 03/26/2017 0849   VLDL 30.0 03/26/2017 0849   LDLCALC 134 (H) 03/26/2017 0849   LDLDIRECT 183.0 07/24/2015 0808     RADIOLOGY: Ct Soft Tissue Neck W Contrast  Result Date: 03/31/2017 CLINICAL DATA:  Bilateral neck fullness noted at dental and MD visit. New onset dry mouth. Question right submandibular abnormality. EXAM: CT NECK WITH CONTRAST TECHNIQUE: Multidetector CT imaging of the neck was performed using the standard protocol following the bolus administration of intravenous contrast. CONTRAST:  28m ISOVUE-300 IOPAMIDOL (ISOVUE-300) INJECTION 61% COMPARISON:  None. FINDINGS: Pharynx and larynx: No mucosal or submucosal lesion Salivary glands: Parotid and submandibular glands are normal. Thyroid: Normal Lymph nodes: No enlarged or low-density nodes. Vascular: Arterial and venous structures are patent. Minimal atherosclerotic calcification at the carotid bifurcation on the right without stenosis or irregularity. Limited intracranial: Normal Visualized orbits: Limited, normal Mastoids and visualized paranasal sinuses: Minimal mucosal thickening in the right division of the sphenoid sinus. Mastoids clear. Skeleton: Ordinary degenerative cervical spondylosis. Upper chest: Normal Other: None IMPRESSION: Negative CT scan. No evidence of neck mass or lymphadenopathy. Salivary glands appear normal and symmetric. Electronically Signed   By: MNelson ChimesM.D.   On: 03/31/2017 10:05     Additional studies/ records that were reviewed today include:  I review the old office records of Dr. WVerl Blalock her carotid duplex study, and recent laboratory.    ASSESSMENT:    1. Palpitations   2. Pure hypercholesterolemia   3. Medication management      PLAN:  Ms. CBintou Lafatais a very pleasant, active 61year old female who is scheduled to undergo left third toe.   Orthopedic surgery by Dr. HDoran Durandon 04/08/2017.  She denies any known history of CAD.  There is a history of hyperlipidemia, and remotely, she was started on low-dose simvastatin and recently has been on pravastatin.  Of note, a carotid study in January 2011 did demonstrate mild plaque bilaterally in her carotids with narrowing in the 0-39% range.  As result, she had evidence for subclinical atherosclerosis noted at that time.  In the past, she  had been on pravastatin and on therapy in May 2017.  Her LDL cholesterol was 113.  Apparently this was held, and in October 2017.  On recheck of laboratory.  Her total cholesterol had risen to 266 with an LDL of 188, and triglycerides 155.  She has been back on pravastatin 40 mg since that time and most recent laboratory on 03/26/2017 shows a total cholesterol 217 with an LDL cholesterol 134 and triglycerides of 150.  She has experienced rare episodes of palpitations which last less than 30 seconds.  These or not exertionally precipitated.  She denies associated presyncope or syncope.  He denies any exertional angina or change in exercise tolerance.  Presently, I am giving her clearance to undergo her planned orthopedic surgery next week.  However, I have recommended subsequent cardiac evaluation be undertaken including an echo Doppler study and following her surgery when she is stable.  A 2 week event monitor.  I have given her prescription for metoprolol, tartrate to take on an as-needed basis if she were to develop an episode of tachycardia dysrhythmia.  With her demonstration of subclinical atherosclerosis.  I have recommended high potency statin therapy rather than pravastatin with pain for an LDL of less than 100.  I have given her prescription to initiate Crestor 20 mg daily.  I will see her back in the office in several months and prior to that visit repeat laboratory will be obtained.   Medication Adjustments/Labs and Tests Ordered: Current medicines are reviewed  at length with the patient today.  Concerns regarding medicines are outlined above.  Medication changes, Labs and Tests ordered today are listed in the Patient Instructions below. Patient Instructions  Medication Instructions:   STOP the pravastatin. This has been replaced with rosuvastatin.  Start metoprolol as directed on the bottle.  Labwork:  c-met and lipid in 2-3 months    Your doctor has ordered you to have blood work. You may go to any lab you choose, however there is a [facility] lab located [place]. Check in with the front office staff. NO APPOINTMENT IS NEED. However they are usually gone between the hours of 1-2 for lunch. The Nurse/Medical Assistant will give you instructions on the need to fast.  Testing/Procedures:  Your physician has requested that you have an echocardiogram tomorrow or Monday 04/07/17  Echocardiography is a painless test that uses sound waves to create images of your heart. It provides your doctor with information about the size and shape of your heart and how well your heart's chambers and valves are working. This procedure takes approximately one hour. There are no restrictions for this procedure.  Your physician has recommended that you wear a 2 week  event monitor for palpitations. Event monitors are medical devices that record the heart's electrical activity. Doctors most often [place] these monitors to diagnose arrhythmias. Arrhythmias are problems with the speed or rhythm of the heartbeat. The monitor is a small, portable device. You can wear one while you do your normal daily activities. This is usually used to diagnose what is causing palpitations/syncope (passing out).      Follow-Up:  After test   Any Other Special Instructions Will Be Listed Below (If Applicable).     Signed, Shelva Majestic, MD  04/03/2017 6:31 PM    [facility] [address], [place], [place], [place]  [address] Phone: [phone]

## 2017-04-04 ENCOUNTER — Ambulatory Visit (HOSPITAL_COMMUNITY): Payer: 59 | Attending: Cardiovascular Disease

## 2017-04-04 ENCOUNTER — Other Ambulatory Visit: Payer: Self-pay

## 2017-04-04 ENCOUNTER — Telehealth: Payer: Self-pay | Admitting: Cardiovascular Disease

## 2017-04-04 DIAGNOSIS — R002 Palpitations: Secondary | ICD-10-CM | POA: Diagnosis not present

## 2017-04-04 NOTE — Telephone Encounter (Signed)
Left message for patient to call back and schedule echo and monitor and follow up appt with dr Claiborne Billings.

## 2017-04-16 ENCOUNTER — Ambulatory Visit: Payer: 59 | Admitting: Cardiovascular Disease

## 2017-04-22 LAB — HM MAMMOGRAPHY

## 2017-04-23 ENCOUNTER — Ambulatory Visit (INDEPENDENT_AMBULATORY_CARE_PROVIDER_SITE_OTHER): Payer: 59

## 2017-04-23 ENCOUNTER — Encounter: Payer: Self-pay | Admitting: Family Medicine

## 2017-04-23 DIAGNOSIS — R002 Palpitations: Secondary | ICD-10-CM

## 2017-05-05 ENCOUNTER — Telehealth: Payer: Self-pay | Admitting: Cardiovascular Disease

## 2017-05-05 NOTE — Telephone Encounter (Signed)
New Message  Pt call requesting to speak with RN about her monitor. Please call back to discuss

## 2017-05-06 ENCOUNTER — Telehealth: Payer: Self-pay | Admitting: *Deleted

## 2017-05-06 NOTE — Telephone Encounter (Signed)
Patient had period of time where her cardiac event monitor telephone was not charging.  [employer] mailed her another Games developer.  After reviewing patient recordings, it appeared there may have been a 5 day period without recordings.  [employer] has been contacted to extend her enrollment period from 14 days to 19 days.  Her End of Service will be 05/12/2017.

## 2017-07-17 ENCOUNTER — Encounter: Payer: Self-pay | Admitting: Family Medicine

## 2017-10-02 ENCOUNTER — Ambulatory Visit (INDEPENDENT_AMBULATORY_CARE_PROVIDER_SITE_OTHER): Payer: 59 | Admitting: *Deleted

## 2017-10-02 DIAGNOSIS — Z23 Encounter for immunization: Secondary | ICD-10-CM

## 2017-10-02 NOTE — Progress Notes (Signed)
Per orders of Dr. Martinique, injection of Shingrix given by Dorrene German. Patient tolerated injection well.  She will return for second dose at CPE in May 2019.  Dorrene German, RN

## 2018-01-08 DIAGNOSIS — Z6826 Body mass index (BMI) 26.0-26.9, adult: Secondary | ICD-10-CM | POA: Diagnosis not present

## 2018-01-08 DIAGNOSIS — Z01419 Encounter for gynecological examination (general) (routine) without abnormal findings: Secondary | ICD-10-CM | POA: Diagnosis not present

## 2018-01-09 ENCOUNTER — Encounter: Payer: Self-pay | Admitting: Cardiovascular Disease

## 2018-01-09 ENCOUNTER — Ambulatory Visit (INDEPENDENT_AMBULATORY_CARE_PROVIDER_SITE_OTHER): Payer: BLUE CROSS/BLUE SHIELD | Admitting: Cardiovascular Disease

## 2018-01-09 VITALS — BP 124/68 | HR 80 | Ht 63.5 in | Wt 151.6 lb

## 2018-01-09 DIAGNOSIS — R238 Other skin changes: Secondary | ICD-10-CM | POA: Diagnosis not present

## 2018-01-09 DIAGNOSIS — I6523 Occlusion and stenosis of bilateral carotid arteries: Secondary | ICD-10-CM

## 2018-01-09 DIAGNOSIS — R002 Palpitations: Secondary | ICD-10-CM

## 2018-01-09 DIAGNOSIS — E785 Hyperlipidemia, unspecified: Secondary | ICD-10-CM | POA: Diagnosis not present

## 2018-01-09 DIAGNOSIS — R233 Spontaneous ecchymoses: Secondary | ICD-10-CM

## 2018-01-09 DIAGNOSIS — Z79899 Other long term (current) drug therapy: Secondary | ICD-10-CM | POA: Diagnosis not present

## 2018-01-09 NOTE — Progress Notes (Addendum)
Cardiology Office Note    Date:  01/10/2018   ID:  AHNYLA MENDEL, DOB 1956/02/15, MRN [id number]  PCP:  Martinique, Betty G, MD  Ortho: Dr. Doran Durand Cardiologist:  Shelva Majestic, MD     History of Present Illness:  BETZY BARBIER is a 62 y.o. female  who I had June 2018 for preoperative clearance prior to foot surgery. She recently saw her OB/GYN doctor, Dr. Milta Deiters and due to easy bruisability he recommended a follow-up visit with me.  Ms. Sherrard denies any known cardiac disease.  However, there is a strong family history for CAD in her mother and father are both deceased.  Remotely, she had been seen by Dr. Mar Daring and last saw him in 2008.  In January 2011 she had undergone a carotid study due to a question of a bruit.  This showed minimal plaque bilaterally with narrowing in the 0-39% range.  With a strong family history for coronary artery disease and a history of cholesterol elevation, when she saw Dr. Verl Blalock he advised initiation of simvastatin for lipid lowering therapy.  She also had been followed by Dr. Stevie Kern, for primary care and Dr. Evette Cristal for GYN.  She now sees Dr. Betty Martinique with Dr. Honor Junes retirement. Sh was changed to  pravastatin 40 mg daily.  She was exercising  regularly and denied any exercise-induced chest pain or palpitations.  Her last year for preoperative evaluation of these episodes of palpitations.  Essentially was normal.  There were no episodes of atrial fibrillation, flutter or SVT.  An echo Doppler study on April 04, 2017 showed normal LV function with grade 2 diastolic dysfunction  She tolerated surgery well.  She admits to easy bruisability for most of her life.  Several weeks ago she was doing hard work in the yard and was bending over and did bang against a fence.  She recently saw Dr. Milta Deiters he noted multiple areas of ecchymosis over her abdomen which were resolving.  He thought best that she be checked out since she had seen me, recommended a  follow-up evaluation.  She admits to gaining weight.  She has been traveling a significant amount and as result has been eating out. She spent 2 months in [place] and was swimming.  She does a lot of  hard yard work.  She has ot been able to exercise as she had in past since she still has discomfort with her foot.  She has never been evaluated for any kind of coagulation disorder.  She denies any issues with excessive clotting or awareness of bleeding.  States that in the past her LDL cholesterols were higher than 200 raising the possibility of familial hyperlipidemia.  He never had her laboratory rechecked when her Crestor was started by me in place of pravastatin last year. When she saw Dr. Nori Riis, due to her complaints of weight gain he was also given a prescription yesterday for for phenteremiine which she filled but has not yet taken.  Past Medical History:  Diagnosis Date  . Allergic rhinitis   . Asthma   . Hyperlipidemia   . Osteoporosis     Past Surgical History:  Procedure Laterality Date  . CHOLECYSTECTOMY    . WISDOM TOOTH EXTRACTION      Current Medications: Outpatient Medications Prior to Visit  Medication Sig Dispense Refill  . Cholecalciferol (VITAMIN D3 PO) Take 4,000 Units by mouth daily.     . metoprolol tartrate (LOPRESSOR) 25 MG tablet Take  as needed for palpitations 30 tablet 6  . phentermine 37.5 MG capsule Take 37.5 mg by mouth every morning.    . rosuvastatin (CRESTOR) 20 MG tablet Take 1 tablet (20 mg total) by mouth daily. 90 tablet 3  . albuterol (PROVENTIL HFA;VENTOLIN HFA) 108 (90 Base) MCG/ACT inhaler Inhale 2 puffs into the lungs every 6 (six) hours as needed for wheezing or shortness of breath. (Patient not taking: Reported on 01/09/2018) 1 Inhaler 3   No facility-administered medications prior to visit.      Allergies:   Aspirin and Codeine   Social History   Socioeconomic History  . Marital status: Married    Spouse name: None  . Number of  children: None  . Years of education: None  . Highest education level: None  Social Needs  . Financial resource strain: None  . Food insecurity - worry: None  . Food insecurity - inability: None  . Transportation needs - medical: None  . Transportation needs - non-medical: None  Occupational History  . None  Tobacco Use  . Smoking status: Never Smoker  . Smokeless tobacco: Never Used  Substance and Sexual Activity  . Alcohol use: Yes    Comment: twice a month  . Drug use: No  . Sexual activity: None  Other Topics Concern  . None  Social History Narrative  . None     Additional social history is notable in that she is married for 36 years.  She has 3 children.  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  She received her degree from [employer].  He does drink alcohol rarely.  She exercises at least 6 days a week for 60 minutes and presently does yoga, bicycling, and weights.  Family History:  The patient's family history includes Alcohol abuse in her father; Diabetes in her mother; Drug abuse in her brother; Heart disease in her father; Heart disease (age of onset: 72) in her brother; Hyperlipidemia in her other; Kidney disease in her mother; Stroke in her mother.  Her brother suffered a myocardial infarction in his 59s, but had been using drugs at the time.  ROS General: Negative; No fevers, chills, or night sweats;  HEENT: Negative; No changes in vision or hearing, sinus congestion, difficulty swallowing Pulmonary: Negative; No cough, wheezing, shortness of breath, hemoptysis Cardiovascular: see HPI GI: Negative; No nausea, vomiting, diarrhea, or abdominal pain U: Negative; No dysuria, hematuria, or difficulty voiding Musculoskeletal: Negative; no myalgias, joint pain, or weakness Hematologic/Oncology: easy bruisability Endocrine: Negative; no heat/cold intolerance; no diabetes Neuro: Negative; no changes in balance, headaches Skin: Negative; No  rashes or skin lesions Psychiatric: Negative; No behavioral problems, depression Sleep: Negative; No snoring, daytime sleepiness, hypersomnolence, bruxism, restless legs, hypnogognic hallucinations, no cataplexy Other comprehensive 14 point system review is negative.   PHYSICAL EXAM:   VS:  BP 124/68   Pulse 80   Ht 5' 3.5" (1.613 m)   Wt 151 lb 9.6 oz (68.8 kg)   BMI 26.43 kg/m     Repeat blood pressure by me 124/70  Wt Readings from Last 3 Encounters:  01/09/18 151 lb 9.6 oz (68.8 kg)  04/03/17 140 lb 6.4 oz (63.7 kg)  03/25/17 138 lb 6 oz (62.8 kg)    General: Alert, oriented, no distress.  Skin: Shows erythema but she underwent a chemical peeling yesterday HEENT: Normocephalic, atraumatic. Pupils equal round and reactive to light; sclera anicteric; extraocular muscles intact; Fundi Normal Nose without nasal septal  hypertrophy Mouth/Parynx benign; Mallinpatti scale 2 Neck: No JVD, no carotid bruits; normal carotid upstroke Lungs: clear to ausculatation and percussion; no wheezing or rales Chest wall: without tenderness to palpitation Heart: PMI not displaced, RRR, s1 s2 normal, 1/6 systolic murmur, no diastolic murmur, no rubs, gallops, thrills, or heaves Abdomen: 8 or 9 areas of resolving ecchymosis which are now greenish/yellow over the abdominal region.   soft, nontender; no hepatosplenomehaly, BS+; abdominal aorta nontender and not dilated by palpation. Back: no CVA tenderness Pulses 2+ Musculoskeletal: full range of motion, normal strength, no joint deformities Extremities: no clubbing cyanosis or edema, Homan's sign negative  Neurologic: grossly nonfocal; Cranial nerves grossly wnl Psychologic: Normal mood and affect   Studies/Labs Reviewed:   EKG:  EKG is not ordered today.   ECG (independently read by me): Normal sinus rhythm at 77 bpm.,  Nonspecific T-wave abnormality without  significant ST-T changes.  Normal intervals with a PR interval 146, and QTc interval  396 ms.  Recent Labs: BMP Latest Ref Rng & Units 03/26/2017 03/19/2016 09/13/2013  Glucose 70 - 99 mg/dL 108(H) 93 93  BUN 6 - 23 mg/dL 18 19 18   Creatinine 0.40 - 1.20 mg/dL 0.76 0.73 0.7  Sodium 135 - 145 mEq/L 140 138 139  Potassium 3.5 - 5.1 mEq/L 4.1 4.1 4.5  Chloride 96 - 112 mEq/L 105 106 103  CO2 19 - 32 mEq/L 29 27 29   Calcium 8.4 - 10.5 mg/dL 9.0 8.9 9.4     Hepatic Function Latest Ref Rng & Units 03/19/2016 01/31/2015 09/13/2013  Total Protein 6.0 - 8.3 g/dL 6.5 6.7 7.0  Albumin 3.5 - 5.2 g/dL 4.1 4.0 4.0  AST 0 - 37 U/L 21 18 18   ALT 0 - 35 U/L 18 17 16   Alk Phosphatase 39 - 117 U/L 60 82 87  Total Bilirubin 0.2 - 1.2 mg/dL 0.4 0.4 0.5  Bilirubin, Direct 0.0 - 0.3 mg/dL 0.1 0.1 0.0    CBC Latest Ref Rng & Units 03/26/2017 03/19/2016 09/13/2013  WBC 4.0 - 10.5 K/uL 3.8(L) 3.5(L) 4.0(L)  Hemoglobin 12.0 - 15.0 g/dL 14.0 13.7 14.3  Hematocrit 36.0 - 46.0 % 40.9 39.8 41.9  Platelets 150.0 - 400.0 K/uL 190.0 195.0 195.0   Lab Results  Component Value Date   MCV 89.8 03/26/2017   MCV 88.9 03/19/2016   MCV 89.4 09/13/2013   Lab Results  Component Value Date   TSH 0.56 03/26/2017   No results found for: HGBA1C   BNP No results found for: BNP  ProBNP No results found for: PROBNP   Lipid Panel     Component Value Date/Time   CHOL 217 (H) 03/26/2017 0849   TRIG 150.0 (H) 03/26/2017 0849   HDL 52.80 03/26/2017 0849   CHOLHDL 4 03/26/2017 0849   VLDL 30.0 03/26/2017 0849   LDLCALC 134 (H) 03/26/2017 0849   LDLDIRECT 183.0 07/24/2015 0808     RADIOLOGY: No results found.   Additional studies/ records that were reviewed today include:  I review the old office records of Dr. Verl Blalock, her carotid duplex study, and recent laboratory.    ASSESSMENT:    1. Hyperlipidemia with target low density lipoprotein (LDL) cholesterol less than 70 mg/dL   2. Mild atherosclerosis of carotid artery, bilateral   3. Medication management   4. Easy bruising   5.  Palpitations      PLAN:  Ms. Louanna Vanliew is a very pleasant, 62 year old female denies any known history of CAD.  However, there  is a history of significant hyperlipidemia and in the past she states LDL cholesterols were 200.  Seen her last year for preoperative evaluation prior to post surgery when she was noting occasional palpitations.  No significant abnormality was detected on monitoring.  She denies any recent palpitations.  However, she admits that she has not been as active as she had been recently with her travel and eating out more frequently out of town she has gained weight.  I saw her in June 2018 with her documentation of previous subclinical atherosclerosis, I change pravastatin to rosuvastatin milligrams which she has been taking without apparent side effects.  She has not had recent blood work and this will need to be done.  She states that several family members also had very high cholesterols.  In addition, she has always noticed that she bruises more easily and several of her sons also seem to do the same.  Last week when bending over she did not bang her abdomen but with pressure being up on her abdomen defense she subsequently developed significant ecchymosis which are now resolving.  She has never seen a hematologist.  She is unaware of any clot disorder or any at the time of surgery.  We will schedule her for laboratory and include a CBC with differential, and chemistry, lipid studies and TSH.  If she has platelet abnormality or an abnormal differential I have suggested possible future hematologic evaluation.  She is not having any episodes of chest pain.  I will contact her regarding the results.  Is recently concerned about weight gain contributed by being away and eating out more often and not having the ability to exercise as she had in the past.  Given a prescription yesterday by Dr. Nori Riis for phenterimine.  With her history of previous palpitations, I have advised against  taking this since this may potentially induce arrhythmia.  I discussed improvement in her diet and discussed the Mediterranean diet.  With her subclinical atherosclerosis, target LDL is less than 70 and if she has not reached this level further medication titration necessary.  I will see her one year for reevaluation.  Medication Adjustments/Labs and Tests Ordered: Current medicines are reviewed at length with the patient today.  Concerns regarding medicines are outlined above.  Medication changes, Labs and Tests ordered today are listed in the Patient Instructions below. Patient Instructions  Medication Instructions:  Your physician recommends that you continue on your current medications as directed. Please refer to the Current Medication list given to you today.  Labwork: Please return for FASTING labs (CMET, CBC w/diff, Lipid, TSH)  Our in office lab hours are Monday-Friday 8:00-4:30, closed for lunch 1-2 pm.  No appointment needed.  Follow-Up: Your physician wants you to follow-up in: 12 months with Dr. Claiborne Billings.  You will receive a reminder letter in the mail two months in advance. If you don't receive a letter, please call our office to schedule the follow-up appointment.   Any Other Special Instructions Will Be Listed Below (If Applicable).     If you need a refill on your cardiac medications before your next appointment, please call your pharmacy.      Signed, Shelva Majestic, MD  01/10/2018 11:59 AM    [facility] [address], [place], [place], [place]  [address] Phone: [phone]

## 2018-01-09 NOTE — Patient Instructions (Signed)
Medication Instructions:  Your physician recommends that you continue on your current medications as directed. Please refer to the Current Medication list given to you today.  Labwork: Please return for FASTING labs (CMET, CBC w/diff, Lipid, TSH)  Our in office lab hours are Monday-Friday 8:00-4:30, closed for lunch 1-2 pm.  No appointment needed.  Follow-Up: Your physician wants you to follow-up in: 12 months with Dr. Claiborne Billings.  You will receive a reminder letter in the mail two months in advance. If you don't receive a letter, please call our office to schedule the follow-up appointment.   Any Other Special Instructions Will Be Listed Below (If Applicable).     If you need a refill on your cardiac medications before your next appointment, please call your pharmacy.

## 2018-01-10 ENCOUNTER — Encounter: Payer: Self-pay | Admitting: Cardiovascular Disease

## 2018-01-12 DIAGNOSIS — Z79899 Other long term (current) drug therapy: Secondary | ICD-10-CM | POA: Diagnosis not present

## 2018-01-12 DIAGNOSIS — R238 Other skin changes: Secondary | ICD-10-CM | POA: Diagnosis not present

## 2018-01-12 DIAGNOSIS — E785 Hyperlipidemia, unspecified: Secondary | ICD-10-CM | POA: Diagnosis not present

## 2018-01-12 DIAGNOSIS — I6523 Occlusion and stenosis of bilateral carotid arteries: Secondary | ICD-10-CM | POA: Diagnosis not present

## 2018-01-13 LAB — COMPREHENSIVE METABOLIC PANEL
ALK PHOS: 82 IU/L (ref 39–117)
ALT: 26 IU/L (ref 0–32)
AST: 28 IU/L (ref 0–40)
Albumin/Globulin Ratio: 2.2 (ref 1.2–2.2)
Albumin: 4.3 g/dL (ref 3.6–4.8)
BILIRUBIN TOTAL: 0.3 mg/dL (ref 0.0–1.2)
BUN / CREAT RATIO: 23 (ref 12–28)
BUN: 17 mg/dL (ref 8–27)
CHLORIDE: 101 mmol/L (ref 96–106)
CO2: 23 mmol/L (ref 20–29)
Calcium: 8.9 mg/dL (ref 8.7–10.3)
Creatinine, Ser: 0.75 mg/dL (ref 0.57–1.00)
GFR calc Af Amer: 99 mL/min/{1.73_m2} (ref >59)
GFR calc non Af Amer: 86 mL/min/{1.73_m2} (ref >59)
Globulin, Total: 2 g/dL (ref 1.5–4.5)
Glucose: 100 mg/dL — ABNORMAL HIGH (ref 65–99)
Potassium: 4.5 mmol/L (ref 3.5–5.2)
Sodium: 140 mmol/L (ref 134–144)
Total Protein: 6.3 g/dL (ref 6.0–8.5)

## 2018-01-13 LAB — CBC WITH DIFFERENTIAL
BASOS: 1 %
Basophils Absolute: 0 10*3/uL (ref 0.0–0.2)
EOS (ABSOLUTE): 0.1 10*3/uL (ref 0.0–0.4)
EOS: 2 %
HEMATOCRIT: 42.8 % (ref 34.0–46.6)
Hemoglobin: 14.7 g/dL (ref 11.1–15.9)
Immature Grans (Abs): 0 10*3/uL (ref 0.0–0.1)
Immature Granulocytes: 0 %
Lymphocytes Absolute: 1.5 10*3/uL (ref 0.7–3.1)
Lymphs: 37 %
MCH: 30.5 pg (ref 26.6–33.0)
MCHC: 34.3 g/dL (ref 31.5–35.7)
MCV: 89 fL (ref 79–97)
MONOS ABS: 0.3 10*3/uL (ref 0.1–0.9)
Monocytes: 7 %
NEUTROS ABS: 2.2 10*3/uL (ref 1.4–7.0)
Neutrophils: 53 %
RBC: 4.82 x10E6/uL (ref 3.77–5.28)
RDW: 14.2 % (ref 12.3–15.4)
WBC: 4.1 10*3/uL (ref 3.4–10.8)

## 2018-01-13 LAB — LIPID PANEL
CHOLESTEROL TOTAL: 169 mg/dL (ref 100–199)
Chol/HDL Ratio: 3 ratio (ref 0.0–4.4)
HDL: 56 mg/dL (ref >39)
LDL Calculated: 79 mg/dL (ref 0–99)
TRIGLYCERIDES: 168 mg/dL — AB (ref 0–149)
VLDL CHOLESTEROL CAL: 34 mg/dL (ref 5–40)

## 2018-01-13 LAB — TSH: TSH: 0.711 u[IU]/mL (ref 0.450–4.500)

## 2018-02-04 LAB — CBC
Hematocrit: 44.7 % (ref 34.0–46.6)
Hemoglobin: 14.5 g/dL (ref 11.1–15.9)
MCH: 30.7 pg (ref 26.6–33.0)
MCHC: 32.4 g/dL (ref 31.5–35.7)
MCV: 95 fL (ref 79–97)
Platelets: 185 10*3/uL (ref 150–379)
RBC: 4.73 x10E6/uL (ref 3.77–5.28)
RDW: 14.5 % (ref 12.3–15.4)
WBC: 4.1 10*3/uL (ref 3.4–10.8)

## 2018-02-04 LAB — SPECIMEN STATUS REPORT

## 2018-03-02 NOTE — Progress Notes (Signed)
HPI:    [name] is a 62 y.o. female, who is here today for her routine physical. She is also concerned about right ear ache that started today and left foot pain, which is chronic.   Last CPE: 03/25/17.   She follows with gyn for her female preventive care, 12/2017.  Regular exercise 3 or more time per week: Not consistently because left foot pain.  Following a healthy diet: Yes. She lives with her husband.  Chronic medical problems: Allergic rhinitis,vit D deficiency, asthma, hyperlipidemia.  HLD on Crestor 20 mg daily. Lab Results  Component Value Date   CHOL 169 01/12/2018   HDL 56 01/12/2018   LDLCALC 79 01/12/2018   LDLDIRECT 183.0 07/24/2015   TRIG 168 (H) 01/12/2018   CHOLHDL 3.0 01/12/2018      Immunization History  Administered Date(s) Administered  . Influenza Split 07/28/2013  . Influenza Whole 08/11/2007, 07/28/2009  . Influenza-Unspecified 07/28/2014, 07/14/2017  . Pneumococcal Polysaccharide-23 03/26/2016  . Td 08/26/2005  . Tdap 02/26/2016  . Zoster 03/06/2011  . Zoster Recombinat (Shingrix) 10/02/2017, 03/03/2018    Mammogram: 03/2017. Colonoscopy: 08/2013 by Dr Earlean Shawl. 5 years f/u was recommended. DEXA: N/A  Hep C screening: 02/2016 NR  Vit D deficiency: Last 25 OH vit D in 02/2017 was 24.29. She is on Vit D 4000 U daily.  Since her last visit she has seen cardiologist due to palpitations. She was started on Metoprolol 25 mg daily as needed. She has not had episodes of palpitations,so has not taking medication.   Left foot pain: she had left foot surgery in 10/2017, since then foot pain has been worse and now constant. According to patient, she has followed with podiatrist and she was told that pain could last up to 1 year. Pain is exacerbated by prolonged walking and relieved by rest. Severe left foot pain,constant. She has some residual numbness. She has not noted weakness. Sometimes she takes OTC NSAIDs.  She woke  up today with right earache, moderate. She took care of her son's puppy, scratches her face sometimes when playing, she is concerned about this causing earache. She has not noted ear drainage or hearing loss. She has not used OTC medications. Some postnasal drainage, attributed to allergies.  Review of Systems  Constitutional: Negative for activity change, appetite change, fatigue and fever.  HENT: Positive for ear pain, postnasal drip and rhinorrhea. Negative for dental problem, ear discharge, mouth sores, nosebleeds, sore throat and trouble swallowing.   Eyes: Negative for redness and visual disturbance.  Respiratory: Negative for cough, shortness of breath and wheezing.   Cardiovascular: Negative for chest pain, palpitations and leg swelling.  Gastrointestinal: Negative for abdominal pain, blood in stool, nausea and vomiting.       Negative for changes in bowel habits.  Endocrine: Negative for cold intolerance, heat intolerance, polydipsia, polyphagia and polyuria.  Genitourinary: Negative for decreased urine volume, dysuria, hematuria, vaginal bleeding and vaginal discharge.  Musculoskeletal: Positive for arthralgias. Negative for gait problem.  Skin: Negative for rash and wound.  Allergic/Immunologic: Positive for environmental allergies.  Neurological: Positive for numbness. Negative for syncope, weakness and headaches.  Hematological: Negative for adenopathy. Does not bruise/bleed easily.  Psychiatric/Behavioral: Negative for confusion.  All other systems reviewed and are negative.     Current Outpatient Medications on File Prior to Visit  Medication Sig Dispense Refill  . Cholecalciferol (VITAMIN D3 PO) Take 4,000 Units by mouth daily.     Marland Kitchen albuterol (PROVENTIL HFA;VENTOLIN  HFA) 108 (90 Base) MCG/ACT inhaler Inhale 2 puffs into the lungs every 6 (six) hours as needed for wheezing or shortness of breath. (Patient not taking: Reported on 01/09/2018) 1 Inhaler 3  . metoprolol  tartrate (LOPRESSOR) 25 MG tablet Take as needed for palpitations (Patient not taking: Reported on 03/03/2018) 30 tablet 6  . rosuvastatin (CRESTOR) 20 MG tablet Take 1 tablet (20 mg total) by mouth daily. 90 tablet 3   No current facility-administered medications on file prior to visit.      Past Medical History:  Diagnosis Date  . Allergic rhinitis   . Asthma   . Hyperlipidemia   . Osteoporosis     Past Surgical History:  Procedure Laterality Date  . CHOLECYSTECTOMY    . WISDOM TOOTH EXTRACTION      Allergies  Allergen Reactions  . Aspirin     REACTION: bruises very easily  . Codeine     Family History  Problem Relation Age of Onset  . Stroke Mother   . Kidney disease Mother   . Diabetes Mother   . Alcohol abuse Father   . Heart disease Father   . Heart disease Brother 11  . Drug abuse Brother   . Hyperlipidemia Other     Social History   Socioeconomic History  . Marital status: Married    Spouse name: Not on file  . Number of children: Not on file  . Years of education: Not on file  . Highest education level: Not on file  Occupational History  . Not on file  Social Needs  . Financial resource strain: Not on file  . Food insecurity:    Worry: Not on file    Inability: Not on file  . Transportation needs:    Medical: Not on file    Non-medical: Not on file  Tobacco Use  . Smoking status: Never Smoker  . Smokeless tobacco: Never Used  Substance and Sexual Activity  . Alcohol use: Yes    Comment: twice a month  . Drug use: No  . Sexual activity: Not on file  Lifestyle  . Physical activity:    Days per week: Not on file    Minutes per session: Not on file  . Stress: Not on file  Relationships  . Social connections:    Talks on phone: Not on file    Gets together: Not on file    Attends religious service: Not on file    Active member of club or organization: Not on file    Attends meetings of clubs or organizations: Not on file    Relationship  status: Not on file  Other Topics Concern  . Not on file  Social History Narrative  . Not on file     Vitals:   03/03/18 0751  BP: 120/76  Pulse: 75  Resp: 12  Temp: 98.8 F (37.1 C)  SpO2: 97%   Body mass index is 25.13 kg/m.   Wt Readings from Last 3 Encounters:  03/03/18 144 lb 2 oz (65.4 kg)  01/09/18 151 lb 9.6 oz (68.8 kg)  04/03/17 140 lb 6.4 oz (63.7 kg)    Physical Exam  Nursing note and vitals reviewed. Constitutional: She is oriented to person, place, and time. She appears well-developed. No distress.  HENT:  Head: Atraumatic.  Right Ear: Hearing, tympanic membrane, external ear and ear canal normal.  Left Ear: Hearing, tympanic membrane, external ear and ear canal normal.  Mouth/Throat: Uvula is midline, oropharynx is  clear and moist and mucous membranes are normal.  Eyes: Pupils are equal, round, and reactive to light. Conjunctivae and EOM are normal.  Neck: No tracheal deviation present. No thyromegaly present.  Cardiovascular: Normal rate and regular rhythm.  No murmur heard. Pulses:      Dorsalis pedis pulses are 2+ on the right side, and 2+ on the left side.  I do not hear murmur today.  Respiratory: Effort normal and breath sounds normal. No respiratory distress.  GI: Soft. She exhibits no mass. There is no hepatomegaly. There is no tenderness.  Genitourinary:  Genitourinary Comments: Deferred to gynecologist.  Musculoskeletal: She exhibits no edema.  No major deformity or signs of synovitis appreciated.  Lymphadenopathy:    She has no cervical adenopathy.       Right: No supraclavicular adenopathy present.       Left: No supraclavicular adenopathy present.  Neurological: She is alert and oriented to person, place, and time. She has normal strength. No cranial nerve deficit. Coordination and gait normal.  Reflex Scores:      Bicep reflexes are 2+ on the right side and 2+ on the left side.      Patellar reflexes are 2+ on the right side and 2+  on the left side. Skin: Skin is warm. No rash noted. No erythema.  Psychiatric: She has a normal mood and affect. Cognition and memory are normal.  Well groomed, good eye contact.    ASSESSMENT AND PLAN:  Ms. LATAJAH THUMAN was here today annual physical examination.   Orders Placed This Encounter  Procedures  . Varicella-zoster vaccine IM (Shingrix)  . Hemoglobin A1c  . VITAMIN D 25 Hydroxy (Vit-D Deficiency, Fractures)   Lab Results  Component Value Date   HGBA1C 6.2 03/03/2018    Routine general medical examination at a health care facility  We discussed the importance of regular physical activity and healthy diet for prevention of chronic illness and/or complications. Preventive guidelines reviewed. Vaccination up-to-date. She will continue following with her gynecologist for her female preventive care. Ca++ ideally through her diet and vit D supplementation to continue Next CPE in a year.  Diabetes mellitus screening -     Hemoglobin A1c  Left foot pain  Aspercreme or icy hot with lidocaine may help. We discussed some side effects of oral NSAIDs. Recommend topical Voltaren 4 times daily as needed. Recommend following with podiatrist.  -     diclofenac sodium (VOLTAREN) 1 % GEL; Apply 4 g topically 4 (four) times daily.  Need for shingles vaccine -     Varicella-zoster vaccine IM (Shingrix)  Earache, right  Ear examination today negative. We discussed possible causes,?  Eustachian tube dysfunction. She can try auto inflation maneuvers. She will continue monitoring. Follow-up as needed.   Vitamin D deficiency Continue vitamin D supplementation 4000 units daily. Further recommendations will be given according to 25 OH vitamin D results. Follow-up in 6 to 12 months.    Return in 1 year (on 03/04/2019) for CPE.     Betty G. Martinique, MD  [facility]. [place] office.

## 2018-03-03 ENCOUNTER — Encounter: Payer: Self-pay | Admitting: Family Medicine

## 2018-03-03 ENCOUNTER — Ambulatory Visit (INDEPENDENT_AMBULATORY_CARE_PROVIDER_SITE_OTHER): Payer: BLUE CROSS/BLUE SHIELD | Admitting: Family Medicine

## 2018-03-03 VITALS — BP 120/76 | HR 75 | Temp 98.8°F | Resp 12 | Ht 63.5 in | Wt 144.1 lb

## 2018-03-03 DIAGNOSIS — E559 Vitamin D deficiency, unspecified: Secondary | ICD-10-CM | POA: Diagnosis not present

## 2018-03-03 DIAGNOSIS — Z0001 Encounter for general adult medical examination with abnormal findings: Secondary | ICD-10-CM

## 2018-03-03 DIAGNOSIS — M79672 Pain in left foot: Secondary | ICD-10-CM | POA: Diagnosis not present

## 2018-03-03 DIAGNOSIS — H9201 Otalgia, right ear: Secondary | ICD-10-CM | POA: Diagnosis not present

## 2018-03-03 DIAGNOSIS — Z23 Encounter for immunization: Secondary | ICD-10-CM

## 2018-03-03 DIAGNOSIS — Z131 Encounter for screening for diabetes mellitus: Secondary | ICD-10-CM

## 2018-03-03 DIAGNOSIS — Z0000 Encounter for general adult medical examination without abnormal findings: Secondary | ICD-10-CM

## 2018-03-03 LAB — HEMOGLOBIN A1C: Hgb A1c MFr Bld: 6.2 % (ref 4.6–6.5)

## 2018-03-03 LAB — VITAMIN D 25 HYDROXY (VIT D DEFICIENCY, FRACTURES): VITD: 37.75 ng/mL (ref 30.00–100.00)

## 2018-03-03 MED ORDER — DICLOFENAC SODIUM 1 % TD GEL: 4.0000 g | Freq: Four times a day (QID) | TRANSDERMAL | 3 refills | Status: DC

## 2018-03-03 NOTE — Patient Instructions (Addendum)
A few things to remember from today's visit:   Routine general medical examination at a health care facility  Vitamin D deficiency - Plan: VITAMIN D 25 Hydroxy (Vit-D Deficiency, Fractures)  Hyperlipidemia, unspecified hyperlipidemia type  Diabetes mellitus screening - Plan: Hemoglobin A1c  Today you have you routine preventive visit.  At least 150 minutes of moderate exercise per week, daily brisk walking for 15-30 min is a good exercise option. Healthy diet low in saturated (animal) fats and sweets and consisting of fresh fruits and vegetables, lean meats such as fish and white chicken and whole grains.  These are some of recommendations for screening depending of age and risk factors:   - Vaccines:  Tdap vaccine every 10 years.  Shingles vaccine recommended at age 84, could be given after 63 years of age but not sure about insurance coverage.   Pneumonia vaccines:  Prevnar 13 at 65 and Pneumovax at 32. Sometimes Pneumovax is giving earlier if history of smoking, lung disease,diabetes,kidney disease among some.    Screening for diabetes at age 74 and every 3 years.  Cervical cancer prevention:  Pap smear starts at 63 years of age and continues periodically until 63 years old in low risk women. Pap smear every 3 years between 94 and 31 years old. Pap smear every 3-5 years between women 17 and older if pap smear negative and HPV screening negative.   -Breast cancer: Mammogram: There is disagreement between experts about when to start screening in low risk asymptomatic female but recent recommendations are to start screening at 20 and not later than 63 years old , every 1-2 years and after 63 yo q 2 years. Screening is recommended until 63 years old but some women can continue screening depending of healthy issues.   Colon cancer screening: starts at 63 years old until 63 years old.  Cholesterol disorder screening at age 67 and every 3 years.  Also recommended:  1. Dental  visit- Brush and floss your teeth twice daily; visit your dentist twice a year. 2. Eye doctor- Get an eye exam at least every 2 years. 3. Helmet use- Always wear a helmet when riding a bicycle, motorcycle, rollerblading or skateboarding. 4. Safe sex- If you may be exposed to sexually transmitted infections, use a condom. 5. Seat belts- Seat belts can save your live; always wear one. 6. Smoke/Carbon Monoxide detectors- These detectors need to be installed on the appropriate level of your home. Replace batteries at least once a year. 7. Skin cancer- When out in the sun please cover up and use sunscreen 15 SPF or higher. 8. Violence- If anyone is threatening or hurting you, please tell your healthcare provider.  9. Drink alcohol in moderation- Limit alcohol intake to one drink or less per day. Never drink and drive.  Please be sure medication list is accurate. If a new problem present, please set up appointment sooner than planned today.

## 2018-03-03 NOTE — Assessment & Plan Note (Signed)
Continue vitamin D supplementation 4000 units daily. Further recommendations will be given according to 25 [place] vitamin D results. Follow-up in 6 to 12 months.

## 2018-03-16 ENCOUNTER — Other Ambulatory Visit: Payer: Self-pay | Admitting: Cardiovascular Disease

## 2018-03-16 DIAGNOSIS — L820 Inflamed seborrheic keratosis: Secondary | ICD-10-CM | POA: Diagnosis not present

## 2018-03-16 DIAGNOSIS — D485 Neoplasm of uncertain behavior of skin: Secondary | ICD-10-CM | POA: Diagnosis not present

## 2018-03-16 DIAGNOSIS — L821 Other seborrheic keratosis: Secondary | ICD-10-CM | POA: Diagnosis not present

## 2018-03-16 NOTE — Telephone Encounter (Signed)
Rx has been sent to the pharmacy electronically. ° °

## 2018-03-17 DIAGNOSIS — H2513 Age-related nuclear cataract, bilateral: Secondary | ICD-10-CM | POA: Diagnosis not present

## 2018-03-17 DIAGNOSIS — H52203 Unspecified astigmatism, bilateral: Secondary | ICD-10-CM | POA: Diagnosis not present

## 2018-03-25 ENCOUNTER — Encounter: Payer: 59 | Admitting: Family Medicine

## 2018-06-05 IMAGING — CT CT NECK W/ CM
4 of 5 series · 15 of 33 positions shown, 17 images · IV contrast (iopamidol)
Comparison: None.

CLINICAL DATA: Bilateral neck fullness noted at dental and BEEVI
visit. New onset dry mouth. Question right submandibular
abnormality.

EXAM:
CT NECK WITH CONTRAST
TECHNIQUE: Multidetector CT imaging of the neck was performed using the
standard protocol following the bolus administration of intravenous
contrast.
CONTRAST:  75mL ES1HGX-722 IOPAMIDOL (ES1HGX-722) INJECTION 61%

[Series 3: neck 2.0 i31s 2 · axial · 0.41mm/px · z∈[-146,-38]mm · 3 of 110 slices shown]
[im 28/110  bone]
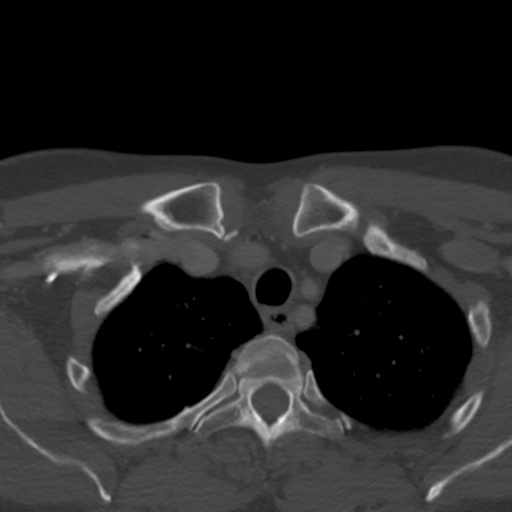
[im 55/110  bone]
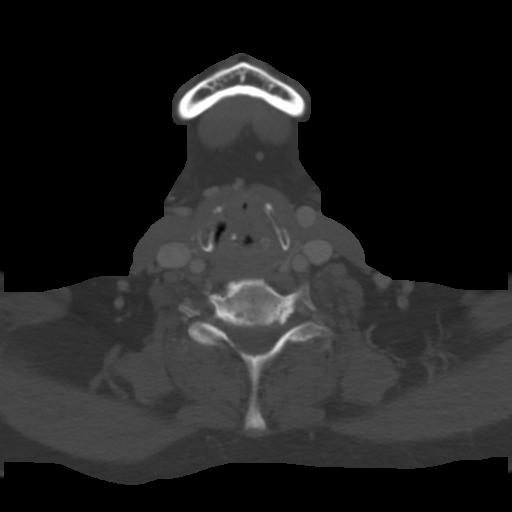
[im 82/110  bone]
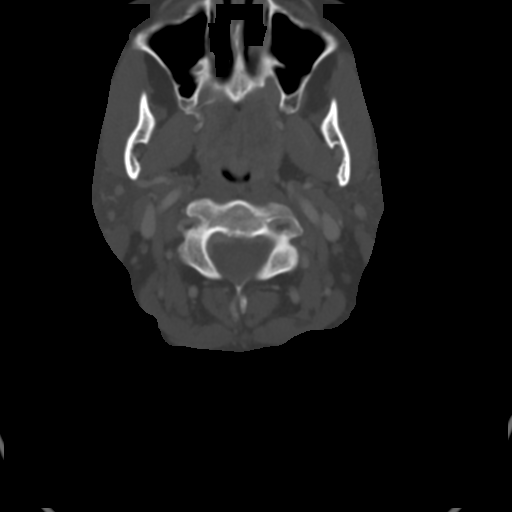

[Series 7: coronal st · coronal · 0.43mm/px · 3 of 119 slices shown]
[im 24/119  bone]
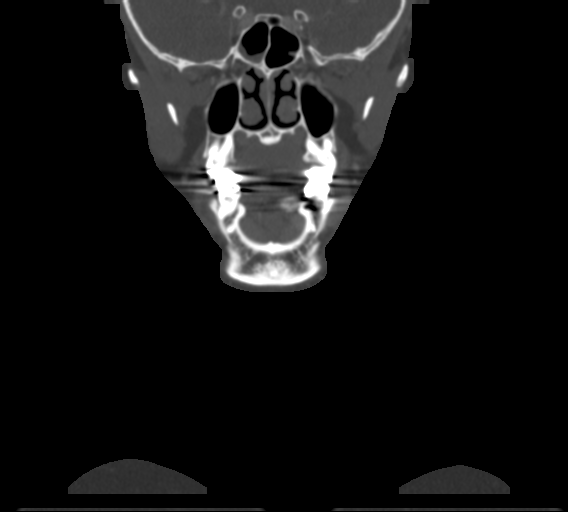
[im 48/119  bone]
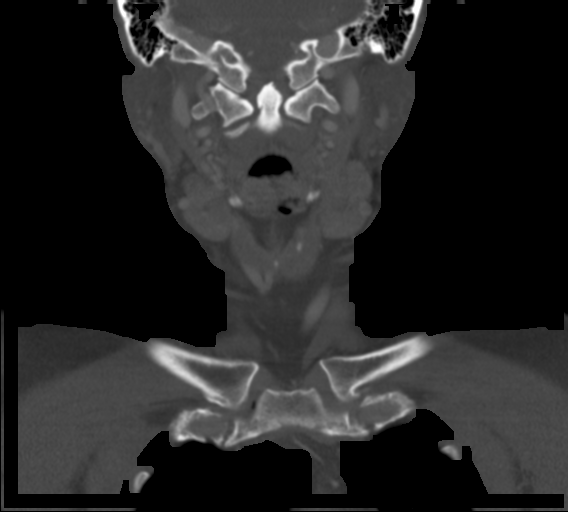
[im 71/119  bone]
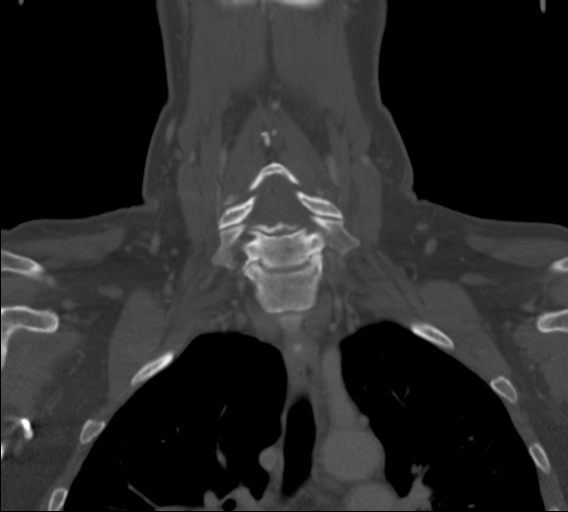

[Series 8: sagittal st · sagittal · 0.43mm/px · 5 of 138 slices shown, 6 images]
[im 46/138  bone]
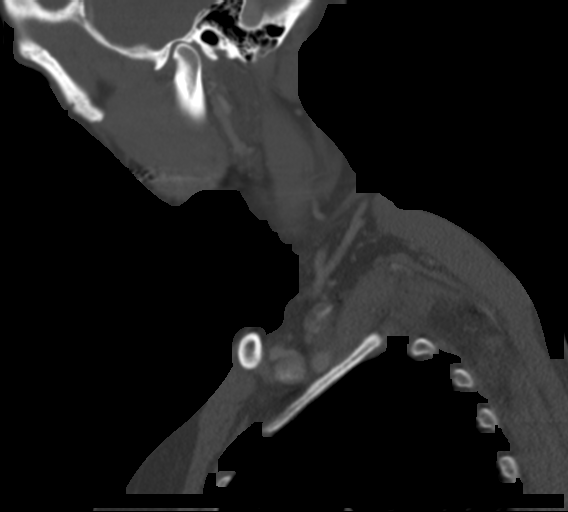
[im 58/138  bone]
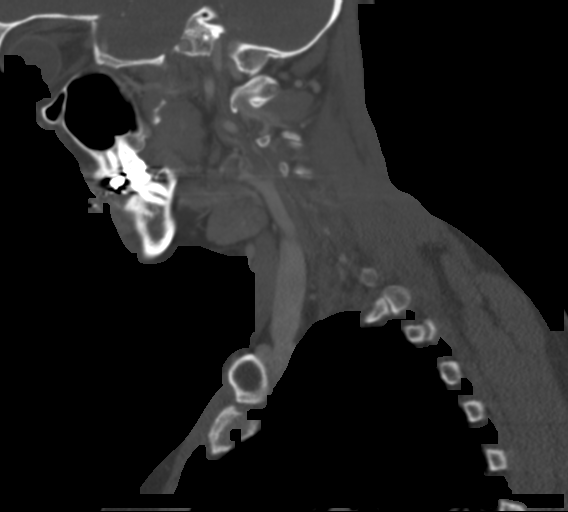
[im 69/138  soft-tissue]
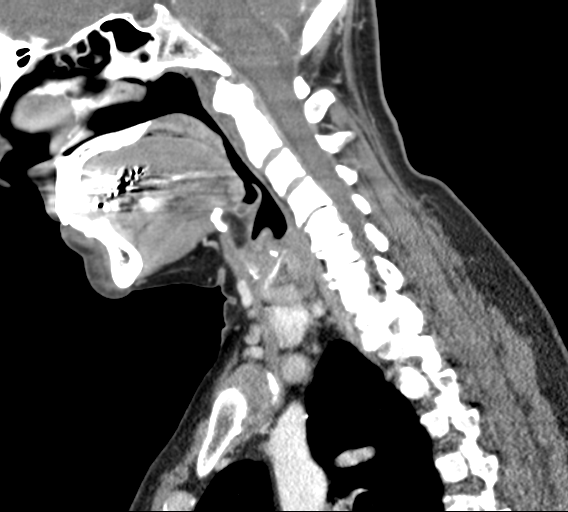
[im 69/138  bone]
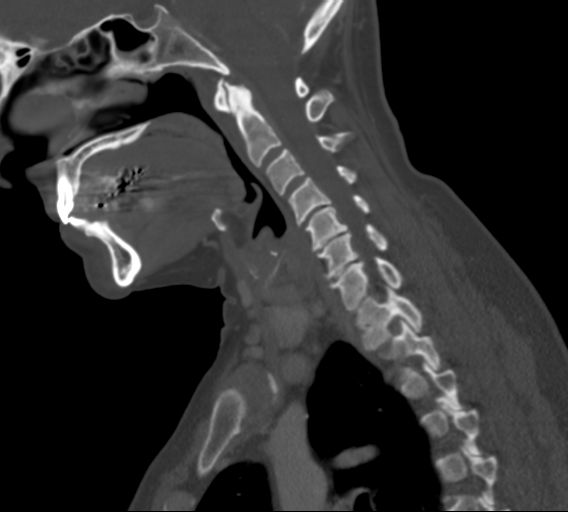
[im 80/138  bone]
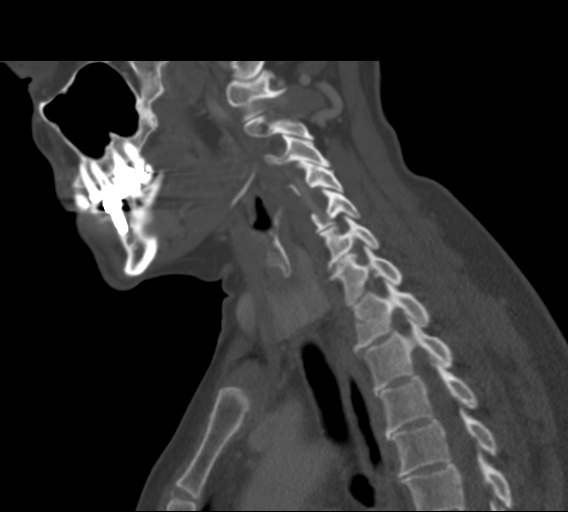
[im 92/138  bone]
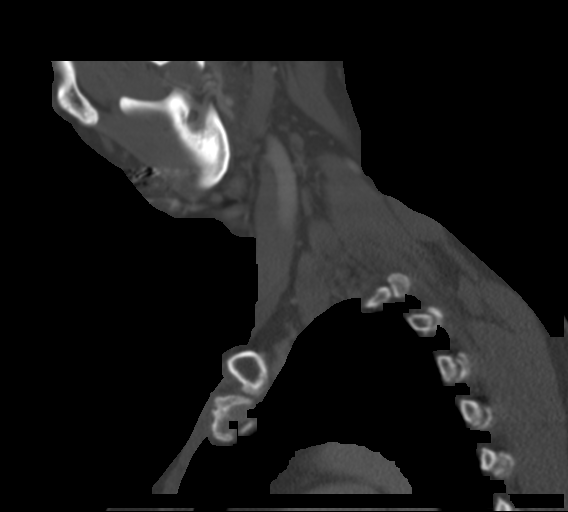

[Series 9: orthogonal · axial · 0.39mm/px · z∈[-184,-48]mm · 4 of 120 slices shown, 5 images]
[im 24/120  soft-tissue]
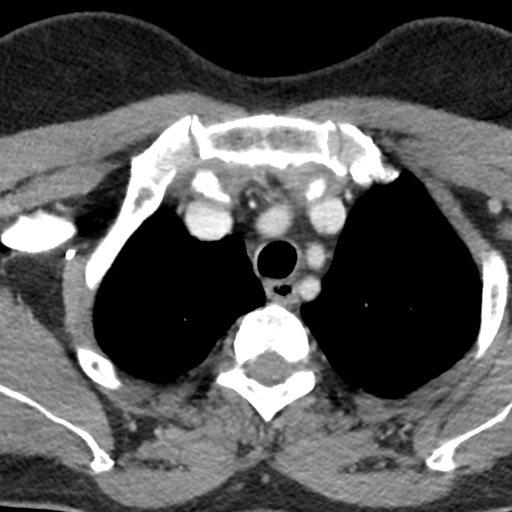
[im 24/120  bone]
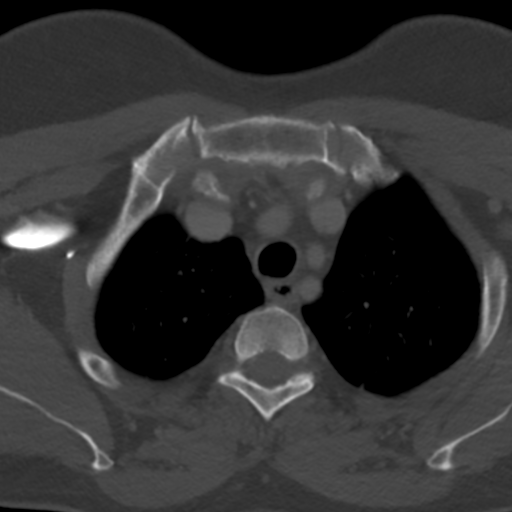
[im 48/120  bone]
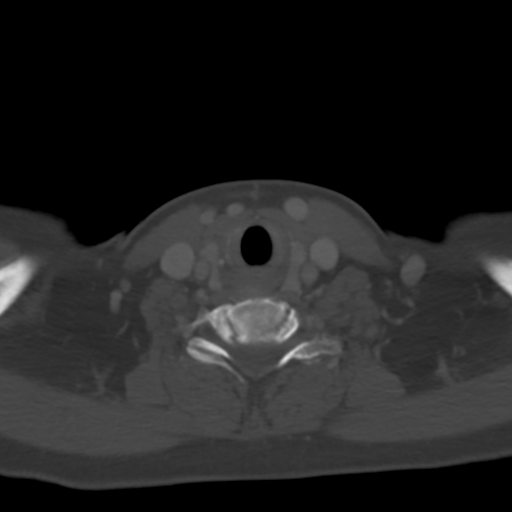
[im 72/120  bone]
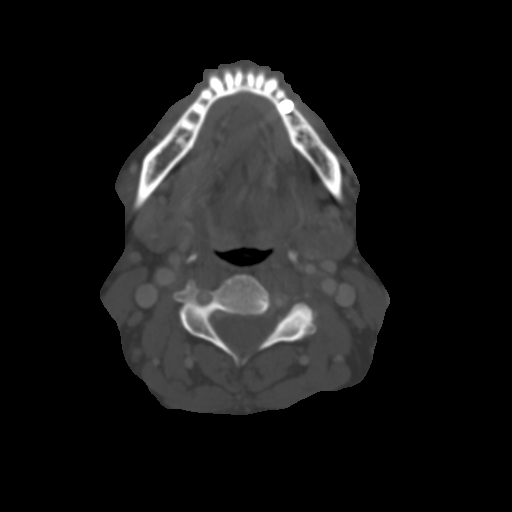
[im 96/120  bone]
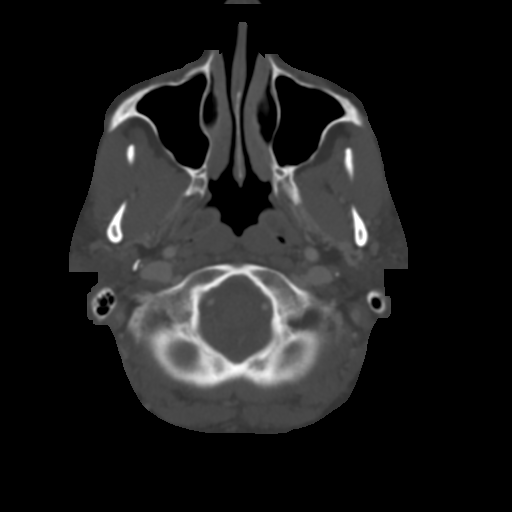

[15 of 33 positions shown; findings below may reference images not displayed]

FINDINGS: Pharynx and larynx: No mucosal or submucosal lesion

Salivary glands: Parotid and submandibular glands are normal.

Thyroid: Normal

Lymph nodes: No enlarged or low-density nodes.

Vascular: Arterial and venous structures are patent. Minimal
atherosclerotic calcification at the carotid bifurcation on the
right without stenosis or irregularity.

Limited intracranial: Normal

Visualized orbits: Limited, normal

Mastoids and visualized paranasal sinuses: Minimal mucosal
thickening in the right division of the sphenoid sinus. Mastoids
clear.

Skeleton: Ordinary degenerative cervical spondylosis.

Upper chest: Normal

Other: None
IMPRESSION: Negative CT scan. No evidence of neck mass or lymphadenopathy.
Salivary glands appear normal and symmetric.

## 2018-07-06 DIAGNOSIS — Z803 Family history of malignant neoplasm of breast: Secondary | ICD-10-CM | POA: Diagnosis not present

## 2018-07-06 DIAGNOSIS — Z1231 Encounter for screening mammogram for malignant neoplasm of breast: Secondary | ICD-10-CM | POA: Diagnosis not present

## 2018-07-06 LAB — HM MAMMOGRAPHY

## 2018-07-08 ENCOUNTER — Encounter: Payer: Self-pay | Admitting: Family Medicine

## 2018-07-23 ENCOUNTER — Telehealth: Payer: Self-pay

## 2018-07-23 ENCOUNTER — Encounter: Payer: Self-pay | Admitting: Internal Medicine

## 2018-07-23 NOTE — Telephone Encounter (Signed)
-----   Message from Irene Shipper, MD sent at 07/22/2018  1:14 PM EDT ----- Regarding: Surveillance colonoscopy Hannah [name], Please let the patient (I know her personally) know that I received her colonoscopy report from Dr. Earlean Shawl dated September 09, 2013.  She had a benign but precancerous polyp for which follow-up colonoscopy in 5 years was recommended.  She would be due for routine surveillance colonoscopy around November 2019.  Please let her know how to schedule when she is ready.  Please convert this to phone note for the record.  Thank you

## 2018-07-23 NOTE — Telephone Encounter (Signed)
Left message for pt that she is due for recall colon in November 2019. Instructed her to call the office to schedule recall colon.

## 2018-08-25 ENCOUNTER — Encounter: Payer: Self-pay | Admitting: Internal Medicine

## 2018-08-25 ENCOUNTER — Ambulatory Visit (AMBULATORY_SURGERY_CENTER): Payer: Self-pay

## 2018-08-25 VITALS — Ht 63.5 in | Wt 143.0 lb

## 2018-08-25 DIAGNOSIS — D369 Benign neoplasm, unspecified site: Secondary | ICD-10-CM

## 2018-08-25 MED ORDER — NA SULFATE-K SULFATE-MG SULF 17.5-3.13-1.6 GM/177ML PO SOLN: 1.0000 | Freq: Once | ORAL | 0 refills | Status: AC

## 2018-08-25 NOTE — Progress Notes (Signed)
Per pt, no allergies to soy or egg products.Pt not taking any weight loss meds or using  O2 at home.  Pt refused emmi video. 

## 2018-09-08 ENCOUNTER — Ambulatory Visit (AMBULATORY_SURGERY_CENTER): Payer: BLUE CROSS/BLUE SHIELD | Admitting: Internal Medicine

## 2018-09-08 ENCOUNTER — Encounter: Payer: Self-pay | Admitting: Internal Medicine

## 2018-09-08 VITALS — BP 134/66 | HR 64 | Temp 98.6°F | Resp 11 | Ht 63.0 in | Wt 144.0 lb

## 2018-09-08 DIAGNOSIS — Z86010 Personal history of colonic polyps: Secondary | ICD-10-CM | POA: Diagnosis not present

## 2018-09-08 DIAGNOSIS — D124 Benign neoplasm of descending colon: Secondary | ICD-10-CM | POA: Diagnosis not present

## 2018-09-08 MED ORDER — SODIUM CHLORIDE 0.9 % IV SOLN: 500.0000 mL | Freq: Once | INTRAVENOUS | Status: DC

## 2018-09-08 NOTE — Op Note (Signed)
[facility] Patient Name: Hannah [name] Procedure Date: 09/08/2018 9:45 AM MRN: [id number] Endoscopist: Docia Chuck. Henrene Pastor , MD Age: 62 Referring MD:  Date of Birth: 1956-05-20 Gender: Female Account #: [id number] Procedure:                Colonoscopy cold snare polypectomy x 1 Indications:              High risk colon cancer surveillance: Personal                            history of non-advanced adenoma. Previous                            examinations 2009 and 2014 with Dr. Earlean Shawl Medicines:                Monitored Anesthesia Care Procedure:                Pre-Anesthesia Assessment:                           - Prior to the procedure, a History and Physical                            was performed, and patient medications and                            allergies were reviewed. The patient's tolerance of                            previous anesthesia was also reviewed. The risks                            and benefits of the procedure and the sedation                            options and risks were discussed with the patient.                            All questions were answered, and informed consent                            was obtained. Prior Anticoagulants: The patient has                            taken no previous anticoagulant or antiplatelet                            agents. ASA Grade Assessment: II - A patient with                            mild systemic disease. After reviewing the risks                            and benefits, the patient was deemed in  satisfactory condition to undergo the procedure.                           After obtaining informed consent, the colonoscope                            was passed under direct vision. Throughout the                            procedure, the patient's blood pressure, pulse, and                            oxygen saturations were monitored continuously. The    Colonoscope was introduced through the anus and                            advanced to the the cecum, identified by                            appendiceal orifice and ileocecal valve. The                            ileocecal valve, appendiceal orifice, and rectum                            were photographed. The quality of the bowel                            preparation was good. The colonoscopy was performed                            without difficulty. The patient tolerated the                            procedure well. The bowel preparation used was                            SUPREP. Scope In: 10:01:58 AM Scope Out: 10:13:35 AM Scope Withdrawal Time: 0 hours 9 minutes 14 seconds  Total Procedure Duration: 0 hours 11 minutes 37 seconds  Findings:                 A 4 mm polyp was found in the descending colon. The                            polyp was removed with a cold snare. Resection and                            retrieval were complete.                           Multiple diverticula were found in the sigmoid                            colon.  The exam was otherwise without abnormality on                            direct and retroflexion views. Complications:            No immediate complications. Estimated blood loss:                            None. Estimated Blood Loss:     Estimated blood loss: none. Impression:               - One 4 mm polyp in the descending colon, removed                            with a cold snare. Resected and retrieved.                           - Diverticulosis in the sigmoid colon.                           - The examination was otherwise normal on direct                            and retroflexion views. Recommendation:           - Repeat colonoscopy in 5 years for surveillance.                           - Patient has a contact number available for                            emergencies. The signs and symptoms of potential                             delayed complications were discussed with the                            patient. Return to normal activities tomorrow.                            Written discharge instructions were provided to the                            patient.                           - Resume previous diet.                           - Continue present medications.                           - Await pathology results. Docia Chuck. Henrene Pastor, MD 09/08/2018 10:18:03 AM This report has been signed electronically.

## 2018-09-08 NOTE — Progress Notes (Signed)
Called to room to assist during endoscopic procedure.  Patient ID and intended procedure confirmed with present staff. Received instructions for my participation in the procedure from the performing physician.  

## 2018-09-08 NOTE — Patient Instructions (Signed)
HANDOUTS GIVEN: POLYPS AND DIVERTICULOSIS   YOU HAD AN ENDOSCOPIC PROCEDURE TODAY AT [facility]:   Refer to the procedure report that was given to you for any specific questions about what was found during the examination.  If the procedure report does not answer your questions, please call your gastroenterologist to clarify.  If you requested that your care partner not be given the details of your procedure findings, then the procedure report has been included in a sealed envelope for you to review at your convenience later.  YOU SHOULD EXPECT: Some feelings of bloating in the abdomen. Passage of more gas than usual.  Walking can help get rid of the air that was put into your GI tract during the procedure and reduce the bloating. If you had a lower endoscopy (such as a colonoscopy or flexible sigmoidoscopy) you may notice spotting of blood in your stool or on the toilet paper. If you underwent a bowel prep for your procedure, you may not have a normal bowel movement for a few days.  Please Note:  You might notice some irritation and congestion in your nose or some drainage.  This is from the oxygen used during your procedure.  There is no need for concern and it should clear up in a day or so.  SYMPTOMS TO REPORT IMMEDIATELY:   Following lower endoscopy (colonoscopy or flexible sigmoidoscopy):  Excessive amounts of blood in the stool  Significant tenderness or worsening of abdominal pains  Swelling of the abdomen that is new, acute  Fever of 100F or higher  For urgent or emergent issues, a gastroenterologist can be reached at any hour by calling [phone].   DIET:  We do recommend a small meal at first, but then you may proceed to your regular diet.  Drink plenty of fluids but you should avoid alcoholic beverages for 24 hours.  ACTIVITY:  You should plan to take it easy for the rest of today and you should NOT DRIVE or use heavy machinery until tomorrow (because of the  sedation medicines used during the test).    FOLLOW UP: Our staff will call the number listed on your records the next business day following your procedure to check on you and address any questions or concerns that you may have regarding the information given to you following your procedure. If we do not reach you, we will leave a message.  However, if you are feeling well and you are not experiencing any problems, there is no need to return our call.  We will assume that you have returned to your regular daily activities without incident.  If any biopsies were taken you will be contacted by phone or by letter within the next 1-3 weeks.  Please call us at [phone] if you have not heard about the biopsies in 3 weeks.    SIGNATURES/CONFIDENTIALITY: You and/or your care partner have signed paperwork which will be entered into your electronic medical record.  These signatures attest to the fact that that the information above on your After Visit Summary has been reviewed and is understood.  Full responsibility of the confidentiality of this discharge information lies with you and/or your care-partner.

## 2018-09-08 NOTE — Progress Notes (Signed)
Report to PACU, RN, vss, BBS= Clear.  

## 2018-09-08 NOTE — Progress Notes (Signed)
Pt's states no medical or surgical changes since previsit or office visit. 

## 2018-09-09 ENCOUNTER — Telehealth: Payer: Self-pay

## 2018-09-09 NOTE — Telephone Encounter (Signed)
  Follow up Call-  Call back number 09/08/2018  Post procedure Call Back phone  # [phone]  Permission to leave phone message Yes  Some recent data might be hidden     Patient questions:  Do you have a fever, pain , or abdominal swelling? No. Pain Score  0 *  Have you tolerated food without any problems? Yes.    Have you been able to return to your normal activities? Yes.    Do you have any questions about your discharge instructions: Diet   No. Medications  No. Follow up visit  No.  Do you have questions or concerns about your Care? No.  Actions: * If pain score is 4 or above: No action needed, pain <4.  Pt reported she felt "puffy".  She said that she did not eat a lot of salt and I told her the prep did have sodium in it.  To push water today and if not back to normal pt will call us back. maw

## 2018-09-11 ENCOUNTER — Encounter: Payer: Self-pay | Admitting: Internal Medicine

## 2018-09-17 DIAGNOSIS — R6882 Decreased libido: Secondary | ICD-10-CM | POA: Diagnosis not present

## 2018-09-17 DIAGNOSIS — N6029 Fibroadenosis of unspecified breast: Secondary | ICD-10-CM | POA: Diagnosis not present

## 2018-10-16 ENCOUNTER — Encounter: Payer: Self-pay | Admitting: Physician Assistant

## 2018-10-16 ENCOUNTER — Ambulatory Visit (INDEPENDENT_AMBULATORY_CARE_PROVIDER_SITE_OTHER): Payer: BLUE CROSS/BLUE SHIELD | Admitting: Physician Assistant

## 2018-10-16 VITALS — BP 138/80 | HR 72 | Temp 98.4°F | Ht 63.0 in | Wt 146.0 lb

## 2018-10-16 DIAGNOSIS — H6981 Other specified disorders of Eustachian tube, right ear: Secondary | ICD-10-CM | POA: Diagnosis not present

## 2018-10-16 MED ORDER — AZITHROMYCIN 250 MG PO TABS: ORAL_TABLET | ORAL | 0 refills | Status: DC

## 2018-10-16 MED ORDER — PREDNISONE 5 MG PO TABS: ORAL_TABLET | ORAL | 0 refills | Status: DC

## 2018-10-16 NOTE — Patient Instructions (Addendum)
It was great to see you!  You have a viral upper respiratory infection. Antibiotics are not needed for this.  Viral infections usually take 7-10 days to resolve.  The cough can last a few weeks to go away.   Start oral prednisone.  If symptoms develop into infection, such as fevers, ear pain, worsening sinus pressure, productive cough -- start oral azithromycin antibiotic.  Push fluids and get plenty of rest. Please return if you are not improving as expected, or if you have high fevers (>101.5) or difficulty swallowing or worsening productive cough.  Call clinic with questions.  I hope you start feeling better soon!   Eustachian Tube Dysfunction  Eustachian tube dysfunction refers to a condition in which a blockage develops in the narrow passage that connects the middle ear to the back of the nose (eustachian tube). The eustachian tube regulates air pressure in the middle ear by letting air move between the ear and nose. It also helps to drain fluid from the middle ear space. Eustachian tube dysfunction can affect one or both ears. When the eustachian tube does not function properly, air pressure, fluid, or both can build up in the middle ear. What are the causes? This condition occurs when the eustachian tube becomes blocked or cannot open normally. Common causes of this condition include:  Ear infections.  Colds and other infections that affect the nose, mouth, and throat (upper respiratory tract).  Allergies.  Irritation from cigarette smoke.  Irritation from stomach acid coming up into the esophagus (gastroesophageal reflux). The esophagus is the tube that carries food from the mouth to the stomach.  Sudden changes in air pressure, such as from descending in an airplane or scuba diving.  Abnormal growths in the nose or throat, such as: ? Growths that line the nose (nasal polyps). ? Abnormal growth of cells (tumors). ? Enlarged tissue at the back of the throat  (adenoids). What increases the risk? You are more likely to develop this condition if:  You smoke.  You are overweight.  You are a child who has: ? Certain birth defects of the mouth, such as cleft palate. ? Large tonsils or adenoids. What are the signs or symptoms? Common symptoms of this condition include:  A feeling of fullness in the ear.  Ear pain.  Clicking or popping noises in the ear.  Ringing in the ear.  Hearing loss.  Loss of balance.  Dizziness. Symptoms may get worse when the air pressure around you changes, such as when you travel to an area of high elevation, fly on an airplane, or go scuba diving. How is this diagnosed? This condition may be diagnosed based on:  Your symptoms.  A physical exam of your ears, nose, and throat.  Tests, such as those that measure: ? The movement of your eardrum (tympanogram). ? Your hearing (audiometry). How is this treated? Treatment depends on the cause and severity of your condition.  In mild cases, you may relieve your symptoms by moving air into your ears. This is called "popping the ears."  In more severe cases, or if you have symptoms of fluid in your ears, treatment may include: ? Medicines to relieve congestion (decongestants). ? Medicines that treat allergies (antihistamines). ? Nasal sprays or ear drops that contain medicines that reduce swelling (steroids). ? A procedure to drain the fluid in your eardrum (myringotomy). In this procedure, a small tube is placed in the eardrum to:  Drain the fluid.  Restore the air in the  middle ear space. ? A procedure to insert a balloon device through the nose to inflate the opening of the eustachian tube (balloon dilation). Follow these instructions at home: Lifestyle  Do not do any of the following until your health care provider approves: ? Travel to high altitudes. ? Fly in airplanes. ? Work in a Pension scheme manager or room. ? Scuba dive.  Do not use any  products that contain nicotine or tobacco, such as cigarettes and e-cigarettes. If you need help quitting, ask your health care provider.  Keep your ears dry. Wear fitted earplugs during showering and bathing. Dry your ears completely after. General instructions  Take over-the-counter and prescription medicines only as told by your health care provider.  Use techniques to help pop your ears as recommended by your health care provider. These may include: ? Chewing gum. ? Yawning. ? Frequent, forceful swallowing. ? Closing your mouth, holding your nose closed, and gently blowing as if you are trying to blow air out of your nose.  Keep all follow-up visits as told by your health care provider. This is important. Contact a health care provider if:  Your symptoms do not go away after treatment.  Your symptoms come back after treatment.  You are unable to pop your ears.  You have: ? A fever. ? Pain in your ear. ? Pain in your head or neck. ? Fluid draining from your ear.  Your hearing suddenly changes.  You become very dizzy.  You lose your balance. Summary  Eustachian tube dysfunction refers to a condition in which a blockage develops in the eustachian tube.  It can be caused by ear infections, allergies, inhaled irritants, or abnormal growths in the nose or throat.  Symptoms include ear pain, hearing loss, or ringing in the ears.  Mild cases are treated with maneuvers to unblock the ears, such as yawning or ear popping.  Severe cases are treated with medicines. Surgery may also be done (rare). This information is not intended to replace advice given to you by your health care provider. Make sure you discuss any questions you have with your health care provider. Document Released: 11/10/2015 Document Revised: 02/03/2018 Document Reviewed: 02/03/2018 Elsevier Interactive Patient Education  2019 [employer].

## 2018-10-16 NOTE — Progress Notes (Signed)
Hannah [name] is a 62 y.o. female here for a new problem.  I acted as a Education administrator for [employer], PA-C Anselmo Pickler, LPN  History of Present Illness:   Chief Complaint  Patient presents with  . Sinus Problem    Sinus Problem  This is a new problem. The current episode started more than 1 month ago. The problem has been waxing and waning since onset. There has been no fever. She is experiencing no pain. Associated symptoms include congestion (right ear), sinus pressure (under right eye area) and sneezing. Pertinent negatives include no coughing, ear pain, headaches or sore throat. (Hearing loss right ear.) Treatments tried: Benadryl. The treatment provided mild relief.   She has been exposed to some friends of hers recently who have been sick with URI symptoms.  She is eating and drinking well.  She has concerns because she is planning to travel with two elderly women next week and she wants to make sure that she is not contagious.   Past Medical History:  Diagnosis Date  . Allergic rhinitis   . Asthma    in the past/ no meds  . Hyperlipidemia   . Osteoporosis      Social History   Socioeconomic History  . Marital status: Married    Spouse name: Not on file  . Number of children: Not on file  . Years of education: Not on file  . Highest education level: Not on file  Occupational History  . Not on file  Social Needs  . Financial resource strain: Not on file  . Food insecurity:    Worry: Not on file    Inability: Not on file  . Transportation needs:    Medical: Not on file    Non-medical: Not on file  Tobacco Use  . Smoking status: Never Smoker  . Smokeless tobacco: Never Used  Substance and Sexual Activity  . Alcohol use: Yes    Comment: social  . Drug use: No  . Sexual activity: Not on file  Lifestyle  . Physical activity:    Days per week: Not on file    Minutes per session: Not on file  . Stress: Not on file  Relationships  . Social  connections:    Talks on phone: Not on file    Gets together: Not on file    Attends religious service: Not on file    Active member of club or organization: Not on file    Attends meetings of clubs or organizations: Not on file    Relationship status: Not on file  . Intimate partner violence:    Fear of current or ex partner: Not on file    Emotionally abused: Not on file    Physically abused: Not on file    Forced sexual activity: Not on file  Other Topics Concern  . Not on file  Social History Narrative  . Not on file    Past Surgical History:  Procedure Laterality Date  . BREAST REDUCTION SURGERY     Bil/  . CHOLECYSTECTOMY    . FOOT SURGERY  2018   left foot surgery to shorten toe and remove damaged nerves/ still have pain/Dr Hewitt  . valve in urteter     to control urine reflux/ age 78  . WISDOM TOOTH EXTRACTION      Family History  Problem Relation Age of Onset  . Stroke Mother   . Kidney disease Mother   . Diabetes Mother   .  Alcohol abuse Father   . Heart disease Father   . Heart disease Brother 45  . Drug abuse Brother   . Hyperlipidemia Other     Allergies  Allergen Reactions  . Aspirin     REACTION: bruises very easily  . Codeine     itching  . Voltaren [Diclofenac]     Voltaren gel- makes her feel crazy    Current Medications:   Current Outpatient Medications:  .  Cholecalciferol (VITAMIN D3 PO), Take 2,000 Units by mouth daily. , Disp: , Rfl:  .  metoprolol tartrate (LOPRESSOR) 25 MG tablet, Take as needed for palpitations, Disp: 30 tablet, Rfl: 6 .  rosuvastatin (CRESTOR) 20 MG tablet, TAKE 1 TABLET(20 MG) BY MOUTH DAILY, Disp: 90 tablet, Rfl: 2 .  albuterol (PROVENTIL HFA;VENTOLIN HFA) 108 (90 Base) MCG/ACT inhaler, Inhale 2 puffs into the lungs every 6 (six) hours as needed for wheezing or shortness of breath. (Patient not taking: Reported on 01/09/2018), Disp: 1 Inhaler, Rfl: 3 .  azithromycin (ZITHROMAX) 250 MG tablet, Take two tablets on  day 1, then one tablet daily x 4 days, Disp: 6 each, Rfl: 0 .  predniSONE (DELTASONE) 5 MG tablet, 6-5-4-3-2-1-off, Disp: 21 tablet, Rfl: 0   Review of Systems:   Review of Systems  HENT: Positive for congestion (right ear), sinus pressure (under right eye area) and sneezing. Negative for ear pain and sore throat.   Respiratory: Negative for cough.   Neurological: Negative for headaches.    Vitals:   Vitals:   10/16/18 0859  BP: 138/80  Pulse: 72  Temp: 98.4 F (36.9 C)  TempSrc: Oral  SpO2: 97%  Weight: 146 lb (66.2 kg)  Height: 5\' 3"  (1.6 m)     Body mass index is 25.86 kg/m.  Physical Exam:   Physical Exam Vitals signs and nursing note reviewed.  Constitutional:      General: She is not in acute distress.    Appearance: She is well-developed. She is not ill-appearing or toxic-appearing.  HENT:     Head: Normocephalic and atraumatic.     Right Ear: Ear canal and external ear normal. Tympanic membrane is not erythematous, retracted or bulging.     Left Ear: Ear canal and external ear normal. Tympanic membrane is not erythematous, retracted or bulging.     Ears:     Comments: Bilateral ears with clear fluid behind TMs without erythema    Nose:     Right Sinus: Maxillary sinus tenderness present. No frontal sinus tenderness.     Left Sinus: Maxillary sinus tenderness present. No frontal sinus tenderness.     Mouth/Throat:     Pharynx: Uvula midline. No posterior oropharyngeal erythema.  Eyes:     General: Lids are normal.     Conjunctiva/sclera: Conjunctivae normal.  Neck:     Trachea: Trachea normal.  Cardiovascular:     Rate and Rhythm: Normal rate and regular rhythm.     Heart sounds: Normal heart sounds, S1 normal and S2 normal.  Pulmonary:     Effort: Pulmonary effort is normal.     Breath sounds: Normal breath sounds. No decreased breath sounds, wheezing, rhonchi or rales.  Lymphadenopathy:     Cervical: No cervical adenopathy.  Skin:    General: Skin  is warm and dry.  Neurological:     Mental Status: She is alert.  Psychiatric:        Speech: Speech normal.        Behavior: Behavior normal.  Behavior is cooperative.     Assessment and Plan:   Hannah [name] was seen today for sinus problem.  Diagnoses and all orders for this visit:  Dysfunction of right eustachian tube  Other orders -     predniSONE (DELTASONE) 5 MG tablet; 6-5-4-3-2-1-off -     azithromycin (ZITHROMAX) 250 MG tablet; Take two tablets on day 1, then one tablet daily x 4 days   No red flags on exam.  Will initiate oral prednisone per orders. She has had oral prednisone in the past and has tolerated well. Discussed taking medications as prescribed. Reviewed return precautions including worsening fever, SOB, worsening cough or other concerns. Push fluids and rest. I recommend that patient follow-up if symptoms worsen or persist despite treatment x 7-10 days, sooner if needed.  I did also give her a safety net prescription of Azithromycin if symptoms worsen during travel.  . Reviewed expectations re: course of current medical issues. . Discussed self-management of symptoms. . Outlined signs and symptoms indicating need for more acute intervention. . Patient verbalized understanding and all questions were answered. . See orders for this visit as documented in the electronic medical record. . Patient received an After-Visit Summary.  CMA or LPN served as scribe during this visit. History, Physical, and Plan performed by medical provider. The above documentation has been reviewed and is accurate and complete.  Inda Coke, PA-C

## 2018-12-03 DIAGNOSIS — M205X2 Other deformities of toe(s) (acquired), left foot: Secondary | ICD-10-CM | POA: Diagnosis not present

## 2018-12-03 DIAGNOSIS — G5762 Lesion of plantar nerve, left lower limb: Secondary | ICD-10-CM | POA: Diagnosis not present

## 2018-12-03 DIAGNOSIS — M79672 Pain in left foot: Secondary | ICD-10-CM | POA: Diagnosis not present

## 2018-12-04 ENCOUNTER — Emergency Department (HOSPITAL_COMMUNITY)
Admission: EM | Admit: 2018-12-04 | Discharge: 2018-12-04 | Disposition: A | Discharge status: Home / Self Care | Payer: BLUE CROSS/BLUE SHIELD | Attending: Emergency Medicine | Admitting: Emergency Medicine

## 2018-12-04 DIAGNOSIS — R030 Elevated blood-pressure reading, without diagnosis of hypertension: Secondary | ICD-10-CM | POA: Diagnosis not present

## 2018-12-04 DIAGNOSIS — T7840XA Allergy, unspecified, initial encounter: Secondary | ICD-10-CM | POA: Diagnosis not present

## 2018-12-04 DIAGNOSIS — T782XXA Anaphylactic shock, unspecified, initial encounter: Secondary | ICD-10-CM

## 2018-12-04 DIAGNOSIS — J45909 Unspecified asthma, uncomplicated: Secondary | ICD-10-CM | POA: Insufficient documentation

## 2018-12-04 DIAGNOSIS — Z79899 Other long term (current) drug therapy: Secondary | ICD-10-CM | POA: Insufficient documentation

## 2018-12-04 DIAGNOSIS — L299 Pruritus, unspecified: Secondary | ICD-10-CM | POA: Diagnosis not present

## 2018-12-04 MED ORDER — FAMOTIDINE IN NACL 20-0.9 MG/50ML-% IV SOLN
20.0000 mg | Freq: Once | INTRAVENOUS | Status: AC
  Administered 2018-12-04: 20 mg via INTRAVENOUS
  Filled 2018-12-04: qty 50

## 2018-12-04 MED ORDER — EPINEPHRINE 0.3 MG/0.3ML IJ SOAJ
0.3000 mg | Freq: Once | INTRAMUSCULAR | Status: AC
  Administered 2018-12-04: 0.3 mg via INTRAMUSCULAR

## 2018-12-04 MED ORDER — METHYLPREDNISOLONE SODIUM SUCC 125 MG IJ SOLR
125.0000 mg | Freq: Once | INTRAMUSCULAR | Status: AC
  Administered 2018-12-04: 125 mg via INTRAVENOUS
  Filled 2018-12-04: qty 2

## 2018-12-04 MED ORDER — LORATADINE 10 MG PO TABS
10.0000 mg | ORAL_TABLET | Freq: Every day | ORAL | Status: DC
  Administered 2018-12-04: 10 mg via ORAL
  Filled 2018-12-04: qty 1

## 2018-12-04 MED ORDER — EPINEPHRINE 0.3 MG/0.3ML IJ SOAJ: 0.3000 mg | INTRAMUSCULAR | 1 refills | Status: DC | PRN

## 2018-12-04 MED ORDER — EPINEPHRINE 0.3 MG/0.3ML IJ SOAJ
INTRAMUSCULAR | Status: AC
  Filled 2018-12-04: qty 0.3

## 2018-12-04 NOTE — Discharge Instructions (Signed)
The cause of your reaction was not identified today.  Please follow up with your family doctor for further evaluation.

## 2018-12-04 NOTE — ED Triage Notes (Signed)
Pt. Stated, I was at [facility] and all of a sudden my body started being on fire and having hives with itching. Took 2 Benadryl

## 2018-12-04 NOTE — ED Provider Notes (Addendum)
Hannah [name] is a 63 y.o. female.  The history is provided by the patient. No language interpreter was used.   Hannah [name] is a 64 y.o. female who presents to the Emergency Department complaining of allergic reaction. She presents to the emergency department for possible allergic reaction. She was trying on clothes at [facility] when she developed a sudden flushing sensation throughout her body with itching. Symptoms began in her scalp and spread throughout her body. She also has tingling to her tongue, throat and lips. No prior similar symptoms in the past. She does have a remote history of asthma but does not currently take any medications. She ate walnuts earlier today but has never had a problem with walnuts. She denies any difficulty breathing, nausea, vomiting. She went to the pharmacy and took two Benadryl prior to ED arrival. Symptoms began about 20 minutes prior to ED arrival. Past Medical History:  Diagnosis Date  . Allergic rhinitis   . Asthma    in the past/ no meds  . Hyperlipidemia   . Osteoporosis     Patient Active Problem List   Diagnosis Date Noted  . Vitamin D deficiency 03/26/2016  . Allergic conjunctivitis of both eyes 04/07/2014  . Folliculitis of nose 11/91/4782  . Routine general medical examination at a health care facility 10/12/2013  . FATIGUE 03/07/2010  . LACERATION, HAND, RIGHT 02/09/2010  . Hyperlipidemia 11/06/2009  . OSTEOPOROSIS 11/12/2007  . ALLERGIC RHINITIS 08/05/2007  . Asthma 08/05/2007    Past Surgical History:  Procedure Laterality Date  . BREAST REDUCTION SURGERY     Bil/  . CHOLECYSTECTOMY    . FOOT SURGERY  2018   left foot surgery to shorten toe and remove damaged nerves/ still have pain/Dr Hewitt   . valve in urteter     to control urine reflux/ age 75  . WISDOM TOOTH EXTRACTION       OB History   No obstetric history on file.      Home Medications    Prior to Admission medications   Medication Sig Start Date End Date Taking? Authorizing Provider  albuterol (PROVENTIL HFA;VENTOLIN HFA) 108 (90 Base) MCG/ACT inhaler Inhale 2 puffs into the lungs every 6 (six) hours as needed for wheezing or shortness of breath. Patient not taking: Reported on 01/09/2018 11/06/15   Dorena Cookey, MD  azithromycin [facility]) 250 MG tablet Take two tablets on day 1, then one tablet daily x 4 days 10/16/18   Inda Coke, PA  Cholecalciferol (VITAMIN D3 PO) Take 2,000 Units by mouth daily.     [provider]  EPINEPHrine 0.3 mg/0.3 mL IJ SOAJ injection Inject 0.3 mLs (0.3 mg total) into the muscle as needed for anaphylaxis. 12/04/18   Quintella Reichert, MD  metoprolol tartrate (LOPRESSOR) 25 MG tablet Take as needed for palpitations 04/03/17   Troy Sine, MD  predniSONE (DELTASONE) 5 MG tablet 6-5-4-3-2-1-off 10/16/18   Inda Coke, PA  rosuvastatin (CRESTOR) 20 MG tablet TAKE 1 TABLET(20 MG) BY MOUTH DAILY 03/16/18   Troy Sine, MD    Family History Family History  Problem Relation Age of Onset  . Stroke Mother   . Kidney disease Mother   . Diabetes Mother   .  Alcohol abuse Father   . Heart disease Father   . Heart disease Brother 67  . Drug abuse Brother   . Hyperlipidemia Other     Social History Social History   Tobacco Use  . Smoking status: Never Smoker  . Smokeless tobacco: Never Used  Substance Use Topics  . Alcohol use: Yes    Comment: social  . Drug use: No     Allergies   Aspirin; Codeine; and Voltaren [diclofenac]   Review of Systems Review of Systems  All other systems reviewed and are negative.     Physical Exam Updated Vital Signs BP 136/74   Pulse 83   Resp (!) 21   SpO2 98%   Physical Exam Vitals signs and nursing note  reviewed.  Constitutional:      Appearance: She is well-developed.  HENT:     Head: Normocephalic and atraumatic.     Mouth/Throat:     Mouth: Mucous membranes are moist.     Pharynx: No posterior oropharyngeal erythema.  Cardiovascular:     Rate and Rhythm: Normal rate and regular rhythm.     Heart sounds: No murmur.  Pulmonary:     Effort: Pulmonary effort is normal. No respiratory distress.     Breath sounds: Normal breath sounds.  Abdominal:     Palpations: Abdomen is soft.     Tenderness: There is no abdominal tenderness. There is no guarding or rebound.  Musculoskeletal:        General: No tenderness.  Skin:    General: Skin is warm and dry.     Comments: Diffuse urticaria of face, trunk, extremities  Neurological:     Mental Status: She is alert and oriented to person, place, and time.  Psychiatric:        Behavior: Behavior normal.      ED Treatments / Results  Labs (all labs ordered are listed, but only abnormal results are displayed) Labs Reviewed - No data to display  EKG EKG Interpretation  Date/Time:  Friday December 04 2018 13:19:41 EST Ventricular Rate:  82 PR Interval:    QRS Duration: 104 QT Interval:  502 QTC Calculation: 587 R Axis:   10 Text Interpretation:  Sinus rhythm Anterior infarct, old Prolonged QT interval Confirmed by Quintella Reichert [phone]) on 12/04/2018 2:03:38 PM   Radiology No results found.  Procedures Procedures (including critical care time) CRITICAL CARE Performed by: Quintella Reichert   Total critical care time: 35 minutes  Critical care time was exclusive of separately billable procedures and treating other patients.  Critical care was necessary to treat or prevent imminent or life-threatening deterioration.  Critical care was time spent personally by me on the following activities: development of treatment plan with patient and/or surrogate as well as nursing, discussions with consultants, evaluation of patient's  response to treatment, examination of patient, obtaining history from patient or surrogate, ordering and performing treatments and interventions, ordering and review of laboratory studies, ordering and review of radiographic studies, pulse oximetry and re-evaluation of patient's condition.  Medications Ordered in ED Medications  EPINEPHrine (EPI-PEN) 0.3 mg/0.3 mL injection (has no administration in time range)  loratadine (CLARITIN) tablet 10 mg (10 mg Oral Given 12/04/18 1503)  EPINEPHrine (EPI-PEN) injection 0.3 mg (0.3 mg Intramuscular Given 12/04/18 1316)  methylPREDNISolone sodium succinate (SOLU-MEDROL) 125 mg/2 mL injection 125 mg (125 mg Intravenous Given 12/04/18 1321)  famotidine (PEPCID) IVPB 20 mg premix (0 mg Intravenous Stopped 12/04/18 1352)     Initial Impression /  Assessment and Plan / ED Course  I have reviewed the triage vital signs and the nursing notes.  Pertinent labs & imaging results that were available during my care of the patient were reviewed by me and considered in my medical decision making (see chart for details).     Patient presents to the emergency department for evaluation of itchy rash, flushing and tingling in her mouth and throat. She is covered in urticaria on ED arrival. She was treated with epinephrine for anaphylaxis. On repeat assessment she is feeling improved, urticaria resolved as well as the tingling in her mouth and throat. Unclear what the source of her reaction was, she did eat nuts earlier today but has not had a problem with this in the past. She does have a shrimp allergy and was near shrimp yesterday but had no problems yesterday. Discussed with patient home care for anaphylaxis. Discussed avoiding nuts and shrimp at this time. Discussed PCP follow-up as well as return precautions.  Final Clinical Impressions(s) / ED Diagnoses   Final diagnoses:  Anaphylaxis, initial encounter    ED Discharge Orders         Ordered    EPINEPHrine 0.3 mg/0.3  mL IJ SOAJ injection  As needed     12/04/18 1708           Quintella Reichert, MD 12/04/18 1709    Quintella Reichert, MD 12/04/18 1710

## 2018-12-05 ENCOUNTER — Other Ambulatory Visit: Payer: Self-pay

## 2018-12-05 ENCOUNTER — Emergency Department (HOSPITAL_COMMUNITY)
Admission: EM | Admit: 2018-12-05 | Discharge: 2018-12-05 | Disposition: A | Discharge status: Home / Self Care | Payer: BLUE CROSS/BLUE SHIELD | Attending: Emergency Medicine | Admitting: Emergency Medicine

## 2018-12-05 ENCOUNTER — Encounter (HOSPITAL_COMMUNITY): Payer: Self-pay | Admitting: Emergency Medicine

## 2018-12-05 DIAGNOSIS — T7840XA Allergy, unspecified, initial encounter: Secondary | ICD-10-CM | POA: Diagnosis not present

## 2018-12-05 DIAGNOSIS — Z79899 Other long term (current) drug therapy: Secondary | ICD-10-CM | POA: Insufficient documentation

## 2018-12-05 DIAGNOSIS — R21 Rash and other nonspecific skin eruption: Secondary | ICD-10-CM | POA: Diagnosis not present

## 2018-12-05 DIAGNOSIS — T7840XD Allergy, unspecified, subsequent encounter: Secondary | ICD-10-CM | POA: Diagnosis not present

## 2018-12-05 DIAGNOSIS — E785 Hyperlipidemia, unspecified: Secondary | ICD-10-CM | POA: Diagnosis not present

## 2018-12-05 DIAGNOSIS — J45909 Unspecified asthma, uncomplicated: Secondary | ICD-10-CM | POA: Insufficient documentation

## 2018-12-05 MED ORDER — PREDNISONE 20 MG PO TABS
40.0000 mg | ORAL_TABLET | Freq: Once | ORAL | Status: AC
  Administered 2018-12-05: 40 mg via ORAL
  Filled 2018-12-05: qty 2

## 2018-12-05 MED ORDER — PREDNISONE 10 MG PO TABS: 40.0000 mg | ORAL_TABLET | Freq: Every day | ORAL | 0 refills | Status: DC

## 2018-12-05 NOTE — ED Provider Notes (Signed)
[facility] EMERGENCY DEPARTMENT Provider Note   CSN: [id number] Arrival date & time: 12/05/18  1826     History   Chief Complaint No chief complaint on file.   HPI Hannah [name] is a 63 y.o. female.  63 year old female with prior medical history as detailed below presents for evaluation of persistent itchy rash and associated diarrhea.  Patient was seen yesterday at this facility for allergic reaction.  She was given epinephrine.  She was given Solu-Medrol.  She was not prescribed prednisone for home use.  She ports that she was feeling better at time of discharge yesterday.  This morning she woke up and had recurrent itchy rash.  This improved somewhat with Benadryl.  She then had loose bowel movement as well.  She returns the ED given that urgent care was not open and available.  She reports that her rash is improved after dosing with Benadryl.  She denies abdominal pain.  She denies fever.  The history is provided by the patient and medical records.  Illness  Location:  Allergic reaction, rash, diarrhea Severity:  Mild Onset quality:  Gradual Duration:  1 day Timing:  Intermittent Progression:  Waxing and waning Chronicity:  New   Past Medical History:  Diagnosis Date  . Allergic rhinitis   . Asthma    in the past/ no meds  . Hyperlipidemia   . Osteoporosis     Patient Active Problem List   Diagnosis Date Noted  . Vitamin D deficiency 03/26/2016  . Allergic conjunctivitis of both eyes 04/07/2014  . Folliculitis of nose 41/93/7902  . Routine general medical examination at a health care facility 10/12/2013  . FATIGUE 03/07/2010  . LACERATION, HAND, RIGHT 02/09/2010  . Hyperlipidemia 11/06/2009  . OSTEOPOROSIS 11/12/2007  . ALLERGIC RHINITIS 08/05/2007  . Asthma 08/05/2007    Past Surgical History:  Procedure Laterality Date  . BREAST REDUCTION SURGERY     Bil/  . CHOLECYSTECTOMY    . FOOT SURGERY  2018   left foot surgery to shorten  toe and remove damaged nerves/ still have pain/Dr Hewitt  . valve in urteter     to control urine reflux/ age 63  . WISDOM TOOTH EXTRACTION       OB History   No obstetric history on file.      Home Medications    Prior to Admission medications   Medication Sig Start Date End Date Taking? Authorizing Provider  albuterol (PROVENTIL HFA;VENTOLIN HFA) 108 (90 Base) MCG/ACT inhaler Inhale 2 puffs into the lungs every 6 (six) hours as needed for wheezing or shortness of breath. Patient not taking: Reported on 01/09/2018 11/06/15   Dorena Cookey, MD  azithromycin [facility]) 250 MG tablet Take two tablets on day 1, then one tablet daily x 4 days 10/16/18   Inda Coke, PA  Cholecalciferol (VITAMIN D3 PO) Take 2,000 Units by mouth daily.     [provider]  EPINEPHrine 0.3 mg/0.3 mL IJ SOAJ injection Inject 0.3 mLs (0.3 mg total) into the muscle as needed for anaphylaxis. 12/04/18   Quintella Reichert, MD  metoprolol tartrate (LOPRESSOR) 25 MG tablet Take as needed for palpitations 04/03/17   Troy Sine, MD  predniSONE (DELTASONE) 10 MG tablet Take 4 tablets (40 mg total) by mouth daily. 12/05/18   Valarie Merino, MD  predniSONE (DELTASONE) 5 MG tablet 6-5-4-3-2-1-off 10/16/18   Inda Coke, PA  rosuvastatin (CRESTOR) 20 MG tablet TAKE 1 TABLET(20 MG) BY MOUTH DAILY 03/16/18  Troy Sine, MD    Family History Family History  Problem Relation Age of Onset  . Stroke Mother   . Kidney disease Mother   . Diabetes Mother   . Alcohol abuse Father   . Heart disease Father   . Heart disease Brother 78  . Drug abuse Brother   . Hyperlipidemia Other     Social History Social History   Tobacco Use  . Smoking status: Never Smoker  . Smokeless tobacco: Never Used  Substance Use Topics  . Alcohol use: Yes    Comment: social  . Drug use: No     Allergies   Aspirin; Codeine; and Voltaren [diclofenac]   Review of Systems Review of Systems  All other systems  reviewed and are negative.    Physical Exam Updated Vital Signs BP 132/73   Pulse 90   Ht 5\' 3"  (1.6 m)   Wt 65.8 kg   SpO2 93%   BMI 25.69 kg/m   Physical Exam Vitals signs and nursing note reviewed.  Constitutional:      General: She is not in acute distress.    Appearance: She is well-developed.  HENT:     Head: Normocephalic and atraumatic.  Eyes:     Conjunctiva/sclera: Conjunctivae normal.     Pupils: Pupils are equal, round, and reactive to light.  Neck:     Musculoskeletal: Normal range of motion and neck supple.  Cardiovascular:     Rate and Rhythm: Normal rate and regular rhythm.     Heart sounds: Normal heart sounds.  Pulmonary:     Effort: Pulmonary effort is normal. No respiratory distress.     Breath sounds: Normal breath sounds.  Abdominal:     General: There is no distension.     Palpations: Abdomen is soft.     Tenderness: There is no abdominal tenderness.  Musculoskeletal: Normal range of motion.        General: No deformity.  Skin:    General: Skin is warm and dry.  Neurological:     General: No focal deficit present.     Mental Status: She is alert and oriented to person, place, and time. Mental status is at baseline.      ED Treatments / Results  Labs (all labs ordered are listed, but only abnormal results are displayed) Labs Reviewed - No data to display  EKG None  Radiology No results found.  Procedures Procedures (including critical care time)  Medications Ordered in ED Medications  predniSONE (DELTASONE) tablet 40 mg (40 mg Oral Given 12/05/18 1907)     Initial Impression / Assessment and Plan / ED Course  I have reviewed the triage vital signs and the nursing notes.  Pertinent labs & imaging results that were available during my care of the patient were reviewed by me and considered in my medical decision making (see chart for details).     MDM  Screen complete  Patient's presentation today is consistent with likely  continuation of her allergic reaction from yesterday.  She is reporting continued intermittent histamine reaction and associated loose bowel movement.  I suspect that she would benefit from a short course of prednisone as an outpatient.  She is given a dose of p.o. prednisone now in the ED.  She will be prescribed with an outpatient prednisone burst.  She aready has established follow-up with both her primary and with her allergist.  Return precautions given and understood.  Importance of close follow-up is stressed. Final  Clinical Impressions(s) / ED Diagnoses   Final diagnoses:  Allergic reaction, subsequent encounter    ED Discharge Orders         Ordered    predniSONE (DELTASONE) 10 MG tablet  Daily     12/05/18 1919           Valarie Merino, MD 12/05/18 [phone]

## 2018-12-05 NOTE — Discharge Instructions (Addendum)
Please return for any problem.  Follow-up with your regular care provider as instructed.  Please take prednisone as prescribed.

## 2018-12-05 NOTE — ED Triage Notes (Signed)
Pt was here yesterday for allergic reaction. Pt today now abdominal pain and diarrhea. Pt still has rash under her eyes on her cheeks.

## 2018-12-05 NOTE — ED Notes (Signed)
Pt alert and oriented in NAD. Pt verbalized understanding of discharge instructions. 

## 2018-12-07 ENCOUNTER — Ambulatory Visit (INDEPENDENT_AMBULATORY_CARE_PROVIDER_SITE_OTHER): Payer: BLUE CROSS/BLUE SHIELD | Admitting: Family Medicine

## 2018-12-07 ENCOUNTER — Encounter: Payer: Self-pay | Admitting: Family Medicine

## 2018-12-07 VITALS — BP 118/78 | HR 77 | Temp 98.4°F | Resp 12 | Ht 63.0 in | Wt 140.1 lb

## 2018-12-07 DIAGNOSIS — R112 Nausea with vomiting, unspecified: Secondary | ICD-10-CM

## 2018-12-07 DIAGNOSIS — T782XXD Anaphylactic shock, unspecified, subsequent encounter: Secondary | ICD-10-CM

## 2018-12-07 MED ORDER — ONDANSETRON 4 MG PO TBDP: 4.0000 mg | ORAL_TABLET | Freq: Three times a day (TID) | ORAL | 0 refills | Status: AC | PRN

## 2018-12-07 NOTE — Patient Instructions (Signed)
A few things to remember from today's visit:   Anaphylaxis, subsequent encounter - Plan: Ambulatory referral to Immunology  Start taking Zyrtec 10 mg twice daily for 7 to 10 days and then continue once daily. Bland diet, advance as tolerated.  Please be sure medication list is accurate. If a new problem present, please set up appointment sooner than planned today.

## 2018-12-07 NOTE — Progress Notes (Signed)
HPI:   Hannah [name] is a 63 y.o. female, who is here today to follow on recent ER visit.  She presented to the ER on 12/04/18 and 12/05/18 allergic reaction.. She started suddenly with generalized pruritus while she was trying clothes at [facility].  C[place]ier noted facial rash and edema, recommended for her to go to the ER. Tingling of tongue,throat,and lips. "Little cough",non productive.  She also had associated GI symptoms: Nausea,vomiting,and diarrhea. She was treated with solu-medrol and recommended continuing with Benadryl on 12/04/18.  She went back to the ER on 12/05/18 because woke up with pruritus and rash but not as bad as the day before.  Dx with anaphylactic reaction. Discharged with epi pen.  No prior Hx of anaphylactic reaction. She is not aware of food allergies. Hx of asthma and seasonal allergies.  She has been eating more almonds, she wonders if she is allergic. She has eaten almonds before and was not eating them when symptoms started. No Hx of insect bite or recent viral illness.  She is feeling better. She is taking Benadryl,it causes drowsiness.  She had last episode of diarrhea this morning. Still mild nausea but vomiting has resolved. Nausea exacerbated by food intake,afraid of eating. Today she has not noted pruritus,rash,or edema. Denies dysphagia or stridor.  Negative for dyspnea or wheezing.   Requesting "allergy testing."   Review of Systems  Constitutional: Positive for fatigue. Negative for appetite change, chills and fever.  HENT: Negative for congestion, mouth sores, trouble swallowing and voice change.   Eyes: Negative for discharge and redness.  Respiratory: Negative for cough, shortness of breath and wheezing.   Cardiovascular: Negative for chest pain, palpitations and leg swelling.  Gastrointestinal: Positive for diarrhea and nausea. Negative for abdominal pain and blood in stool.  Musculoskeletal: Negative for gait problem  and myalgias.  Allergic/Immunologic: Positive for environmental allergies. Negative for food allergies.  Neurological: Negative for weakness, numbness and headaches.  Psychiatric/Behavioral: Negative for confusion. The patient is nervous/anxious.     Current Outpatient Medications on File Prior to Visit  Medication Sig Dispense Refill  . albuterol (PROVENTIL HFA;VENTOLIN HFA) 108 (90 Base) MCG/ACT inhaler Inhale 2 puffs into the lungs every 6 (six) hours as needed for wheezing or shortness of breath. 1 Inhaler 3  . Cholecalciferol (VITAMIN D3 PO) Take 2,000 Units by mouth daily.     Marland Kitchen EPINEPHrine 0.3 mg/0.3 mL IJ SOAJ injection Inject 0.3 mLs (0.3 mg total) into the muscle as needed for anaphylaxis. 1 Device 1  . metoprolol tartrate (LOPRESSOR) 25 MG tablet Take as needed for palpitations 30 tablet 6  . rosuvastatin (CRESTOR) 20 MG tablet TAKE 1 TABLET(20 MG) BY MOUTH DAILY 90 tablet 2  . predniSONE (DELTASONE) 10 MG tablet Take 4 tablets (40 mg total) by mouth daily. (Patient not taking: Reported on 12/07/2018) 12 tablet 0   No current facility-administered medications on file prior to visit.      Past Medical History:  Diagnosis Date  . Allergic rhinitis   . Asthma    in the past/ no meds  . Hyperlipidemia   . Osteoporosis    Allergies  Allergen Reactions  . Aspirin     REACTION: bruises very easily  . Codeine     itching  . Voltaren [Diclofenac]     Voltaren gel- makes her feel crazy    Social History   Socioeconomic History  . Marital status: Married    Spouse name: Not on  file  . Number of children: Not on file  . Years of education: Not on file  . Highest education level: Not on file  Occupational History  . Not on file  Social Needs  . Financial resource strain: Not on file  . Food insecurity:    Worry: Not on file    Inability: Not on file  . Transportation needs:    Medical: Not on file    Non-medical: Not on file  Tobacco Use  . Smoking status: Never  Smoker  . Smokeless tobacco: Never Used  Substance and Sexual Activity  . Alcohol use: Yes    Comment: social  . Drug use: No  . Sexual activity: Not on file  Lifestyle  . Physical activity:    Days per week: Not on file    Minutes per session: Not on file  . Stress: Not on file  Relationships  . Social connections:    Talks on phone: Not on file    Gets together: Not on file    Attends religious service: Not on file    Active member of club or organization: Not on file    Attends meetings of clubs or organizations: Not on file    Relationship status: Not on file  Other Topics Concern  . Not on file  Social History Narrative  . Not on file    Vitals:   12/07/18 1504  BP: 118/78  Pulse: 77  Resp: 12  Temp: 98.4 F (36.9 C)  SpO2: 97%   Body mass index is 24.82 kg/m.   Physical Exam  Nursing note and vitals reviewed. Constitutional: She is oriented to person, place, and time. She appears well-developed and well-nourished. No distress.  HENT:  Head: Normocephalic and atraumatic.  Mouth/Throat: Oropharynx is clear and moist and mucous membranes are normal.  Eyes: Pupils are equal, round, and reactive to light. Conjunctivae are normal.  Cardiovascular: Normal rate and regular rhythm.  No murmur heard. Respiratory: Effort normal and breath sounds normal. No respiratory distress.  GI: Soft. She exhibits no mass. There is no hepatomegaly. There is no abdominal tenderness.  Musculoskeletal:        General: No edema.  Lymphadenopathy:    She has no cervical adenopathy.  Neurological: She is alert and oriented to person, place, and time. She has normal strength. No cranial nerve deficit. Gait normal.  Skin: Skin is warm. No rash noted. No erythema.  Psychiatric: She has a normal mood and affect.  Well groomed, good eye contact.    ASSESSMENT AND PLAN:  Ms. Rossana was seen today for ER follow-up.  Orders Placed This Encounter  Procedures  . Ambulatory referral to  Immunology    Anaphylaxis, subsequent encounter Most symptom resolved. She did fill Rx for Epi pen,side effects of med discussed. Recommend Zyrtec 10 mg bid for 7-10 days then daily. Instructed about warning signs.  -     Ambulatory referral to Immunology  Nausea and vomiting in adult Recommend bland diet,small portions at the time. She can take take Zofran 4 mg 15 min before eating. Advance diet as tolerated. Instructed about warning signs.  -     ondansetron (ZOFRAN ODT) 4 MG disintegrating tablet; Take 1 tablet (4 mg total) by mouth every 8 (eight) hours as needed for up to 3 days for nausea or vomiting.   25 min face to face OV. > 50% was dedicated to discussion of Dx, prognosis, treatment options, and some side effects of medications.  Planning on traveling to [place] to visit her new grandchild.She wonders if she needs to wear a mask in the airplane and airport, she is afraid of coronavirus infection exposure. If she is not having respiratory symptoms she does not need to wear a face mask. But It is Ok to wear mask if she feels better doing so.     G. Martinique, MD  [facility]. [place] office.

## 2018-12-08 ENCOUNTER — Ambulatory Visit: Payer: BLUE CROSS/BLUE SHIELD | Admitting: Family Medicine

## 2018-12-14 DIAGNOSIS — G5760 Lesion of plantar nerve, unspecified lower limb: Secondary | ICD-10-CM | POA: Diagnosis not present

## 2019-01-04 ENCOUNTER — Other Ambulatory Visit: Payer: Self-pay

## 2019-01-04 ENCOUNTER — Telehealth: Payer: Self-pay | Admitting: Cardiovascular Disease

## 2019-01-04 MED ORDER — ROSUVASTATIN CALCIUM 20 MG PO TABS: ORAL_TABLET | ORAL | 2 refills | Status: DC

## 2019-01-04 NOTE — Telephone Encounter (Signed)
°*  STAT* If patient is at the pharmacy, call can be transferred to refill team.   1. Which medications need to be refilled? (please list name of each medication and dose if known) rosuvastatin (CRESTOR) 20 MG tablet  2. Which pharmacy/location (including street and city if local pharmacy) is medication to be sent to? [place], [place] [employer]  3. Do they need a 30 day or 90 day supply? 30 days

## 2019-01-11 ENCOUNTER — Ambulatory Visit: Payer: Self-pay | Admitting: Allergy & Immunology

## 2019-01-11 DIAGNOSIS — Z6825 Body mass index (BMI) 25.0-25.9, adult: Secondary | ICD-10-CM | POA: Diagnosis not present

## 2019-01-11 DIAGNOSIS — Z01419 Encounter for gynecological examination (general) (routine) without abnormal findings: Secondary | ICD-10-CM | POA: Diagnosis not present

## 2019-01-11 DIAGNOSIS — Z13820 Encounter for screening for osteoporosis: Secondary | ICD-10-CM | POA: Diagnosis not present

## 2019-01-14 DIAGNOSIS — Z91030 Bee allergy status: Secondary | ICD-10-CM | POA: Diagnosis not present

## 2019-01-14 DIAGNOSIS — H1045 Other chronic allergic conjunctivitis: Secondary | ICD-10-CM | POA: Diagnosis not present

## 2019-01-14 DIAGNOSIS — J3089 Other allergic rhinitis: Secondary | ICD-10-CM | POA: Diagnosis not present

## 2019-01-14 DIAGNOSIS — J301 Allergic rhinitis due to pollen: Secondary | ICD-10-CM | POA: Diagnosis not present

## 2019-01-19 ENCOUNTER — Telehealth: Payer: Self-pay | Admitting: Cardiovascular Disease

## 2019-01-19 NOTE — Telephone Encounter (Signed)
New Message:    Pt wants to know if she can her lab work from her Allergy doctor at  [place] the same time she gets her annual lab work here?

## 2019-01-19 NOTE — Telephone Encounter (Signed)
Pt states blood work was for like blueberries ,hazelnut panel ,and etc.Informed pt these should be done where she normally would have  Allergy labs done.Pt verbalizes understanding .Adonis Housekeeper

## 2019-02-24 ENCOUNTER — Ambulatory Visit: Payer: BLUE CROSS/BLUE SHIELD | Admitting: Cardiovascular Disease

## 2019-03-12 DIAGNOSIS — H2513 Age-related nuclear cataract, bilateral: Secondary | ICD-10-CM | POA: Diagnosis not present

## 2019-03-16 DIAGNOSIS — T781XXA Other adverse food reactions, not elsewhere classified, initial encounter: Secondary | ICD-10-CM | POA: Diagnosis not present

## 2019-05-10 DIAGNOSIS — L821 Other seborrheic keratosis: Secondary | ICD-10-CM | POA: Diagnosis not present

## 2019-05-10 DIAGNOSIS — L814 Other melanin hyperpigmentation: Secondary | ICD-10-CM | POA: Diagnosis not present

## 2019-05-10 DIAGNOSIS — L738 Other specified follicular disorders: Secondary | ICD-10-CM | POA: Diagnosis not present

## 2019-05-10 DIAGNOSIS — I788 Other diseases of capillaries: Secondary | ICD-10-CM | POA: Diagnosis not present

## 2019-06-22 ENCOUNTER — Encounter: Payer: Self-pay | Admitting: Family Medicine

## 2019-06-28 ENCOUNTER — Other Ambulatory Visit: Payer: Self-pay

## 2019-06-28 ENCOUNTER — Ambulatory Visit (INDEPENDENT_AMBULATORY_CARE_PROVIDER_SITE_OTHER): Payer: BC Managed Care – PPO | Admitting: Cardiovascular Disease

## 2019-06-28 ENCOUNTER — Encounter: Payer: Self-pay | Admitting: Cardiovascular Disease

## 2019-06-28 VITALS — BP 142/72 | HR 68 | Temp 97.5°F | Ht 63.0 in | Wt 145.0 lb

## 2019-06-28 DIAGNOSIS — I6523 Occlusion and stenosis of bilateral carotid arteries: Secondary | ICD-10-CM

## 2019-06-28 DIAGNOSIS — E785 Hyperlipidemia, unspecified: Secondary | ICD-10-CM | POA: Diagnosis not present

## 2019-06-28 DIAGNOSIS — R002 Palpitations: Secondary | ICD-10-CM

## 2019-06-28 MED ORDER — ROSUVASTATIN CALCIUM 20 MG PO TABS: ORAL_TABLET | ORAL | 2 refills | Status: DC

## 2019-06-28 MED ORDER — METOPROLOL TARTRATE 25 MG PO TABS: ORAL_TABLET | ORAL | 6 refills | Status: DC
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
## 2019-06-28 NOTE — Patient Instructions (Signed)

## 2019-06-30 ENCOUNTER — Encounter: Payer: Self-pay | Admitting: Cardiovascular Disease

## 2019-07-09 NOTE — Addendum Note (Signed)
Addended by: Therisa Doyne on: 07/09/2019 11:33 AM   Modules accepted: Orders

## 2019-07-20 DIAGNOSIS — Z1231 Encounter for screening mammogram for malignant neoplasm of breast: Secondary | ICD-10-CM | POA: Diagnosis not present

## 2019-07-20 DIAGNOSIS — Z803 Family history of malignant neoplasm of breast: Secondary | ICD-10-CM | POA: Diagnosis not present

## 2019-07-20 LAB — HM MAMMOGRAPHY

## 2019-07-21 ENCOUNTER — Encounter: Payer: Self-pay | Admitting: Family Medicine

## 2019-07-21 DIAGNOSIS — Z91030 Bee allergy status: Secondary | ICD-10-CM | POA: Diagnosis not present

## 2019-07-21 DIAGNOSIS — H1045 Other chronic allergic conjunctivitis: Secondary | ICD-10-CM | POA: Diagnosis not present

## 2019-07-21 DIAGNOSIS — J301 Allergic rhinitis due to pollen: Secondary | ICD-10-CM | POA: Diagnosis not present

## 2019-07-21 DIAGNOSIS — J3089 Other allergic rhinitis: Secondary | ICD-10-CM | POA: Diagnosis not present

## 2019-07-23 ENCOUNTER — Encounter: Payer: Self-pay | Admitting: Family Medicine

## 2019-07-29 DIAGNOSIS — T781XXD Other adverse food reactions, not elsewhere classified, subsequent encounter: Secondary | ICD-10-CM | POA: Diagnosis not present

## 2019-08-06 DIAGNOSIS — J3089 Other allergic rhinitis: Secondary | ICD-10-CM | POA: Diagnosis not present

## 2019-08-06 DIAGNOSIS — J301 Allergic rhinitis due to pollen: Secondary | ICD-10-CM | POA: Diagnosis not present

## 2019-08-06 DIAGNOSIS — Z91030 Bee allergy status: Secondary | ICD-10-CM | POA: Diagnosis not present

## 2019-08-06 DIAGNOSIS — H1045 Other chronic allergic conjunctivitis: Secondary | ICD-10-CM | POA: Diagnosis not present

## 2019-08-16 ENCOUNTER — Ambulatory Visit (INDEPENDENT_AMBULATORY_CARE_PROVIDER_SITE_OTHER): Payer: BC Managed Care – PPO | Admitting: Family Medicine

## 2019-08-16 ENCOUNTER — Encounter: Payer: Self-pay | Admitting: Family Medicine

## 2019-08-16 ENCOUNTER — Other Ambulatory Visit: Payer: Self-pay

## 2019-08-16 VITALS — BP 140/80 | HR 78 | Temp 97.0°F | Resp 12 | Ht 63.0 in | Wt 137.6 lb

## 2019-08-16 DIAGNOSIS — Z130 Encounter for screening for diseases of the blood and blood-forming organs and certain disorders involving the immune mechanism: Secondary | ICD-10-CM

## 2019-08-16 DIAGNOSIS — E785 Hyperlipidemia, unspecified: Secondary | ICD-10-CM | POA: Diagnosis not present

## 2019-08-16 DIAGNOSIS — Z13228 Encounter for screening for other metabolic disorders: Secondary | ICD-10-CM

## 2019-08-16 DIAGNOSIS — Z0000 Encounter for general adult medical examination without abnormal findings: Secondary | ICD-10-CM | POA: Diagnosis not present

## 2019-08-16 DIAGNOSIS — E559 Vitamin D deficiency, unspecified: Secondary | ICD-10-CM

## 2019-08-16 DIAGNOSIS — Z1329 Encounter for screening for other suspected endocrine disorder: Secondary | ICD-10-CM

## 2019-08-16 LAB — LIPID PANEL
Cholesterol: 185 mg/dL (ref 0–200)
HDL: 43.2 mg/dL (ref >39.00)
LDL Cholesterol: 104 mg/dL — ABNORMAL HIGH (ref 0–99)
NonHDL: 142.28
Total CHOL/HDL Ratio: 4
Triglycerides: 192 mg/dL — ABNORMAL HIGH (ref 0.0–149.0)
VLDL: 38.4 mg/dL (ref 0.0–40.0)

## 2019-08-16 LAB — COMPREHENSIVE METABOLIC PANEL
ALT: 20 U/L (ref 0–35)
AST: 26 U/L (ref 0–37)
Albumin: 4.5 g/dL (ref 3.5–5.2)
Alkaline Phosphatase: 72 U/L (ref 39–117)
BUN: 17 mg/dL (ref 6–23)
CO2: 27 mEq/L (ref 19–32)
Calcium: 9.3 mg/dL (ref 8.4–10.5)
Chloride: 100 mEq/L (ref 96–112)
Creatinine, Ser: 0.68 mg/dL (ref 0.40–1.20)
GFR: 87.07 mL/min (ref >60.00)
Glucose, Bld: 76 mg/dL (ref 70–99)
Potassium: 4.5 mEq/L (ref 3.5–5.1)
Sodium: 138 mEq/L (ref 135–145)
Total Bilirubin: 0.7 mg/dL (ref 0.2–1.2)
Total Protein: 6.5 g/dL (ref 6.0–8.3)

## 2019-08-16 LAB — HEMOGLOBIN A1C: Hgb A1c MFr Bld: 6.2 % (ref 4.6–6.5)

## 2019-08-16 LAB — VITAMIN D 25 HYDROXY (VIT D DEFICIENCY, FRACTURES): VITD: 74.55 ng/mL (ref 30.00–100.00)

## 2019-08-16 NOTE — Patient Instructions (Signed)
Today you have you routine preventive visit.  At least 150 minutes of moderate exercise per week, daily brisk walking for 15-30 min is a good exercise option. Healthy diet low in saturated (animal) fats and sweets and consisting of fresh fruits and vegetables, lean meats such as fish and white chicken and whole grains.  These are some of recommendations for screening depending of age and risk factors: A few things to remember from today's visit:   Routine general medical examination at a health care facility  Vitamin D deficiency - Plan: VITAMIN D 25 Hydroxy (Vit-D Deficiency, Fractures)  Hyperlipidemia, unspecified hyperlipidemia type - Plan: Lipid panel  Screening for endocrine, metabolic and immunity disorder - Plan: Hemoglobin A1c, Comprehensive metabolic panel   - Vaccines:  Tdap vaccine every 10 years.  Shingles vaccine recommended at age 64, could be given after 64 years of age but not sure about insurance coverage.   Pneumonia vaccines:  Prevnar 13 at 65 and Pneumovax at 49. Sometimes Pneumovax is giving earlier if history of smoking, lung disease,diabetes,kidney disease among some.    Screening for diabetes at age 10 and every 3 years.  Cervical cancer prevention:  Pap smear starts at 64 years of age and continues periodically until 64 years old in low risk women. Pap smear every 3 years between 62 and 42 years old. Pap smear every 3-5 years between women 16 and older if pap smear negative and HPV screening negative.   -Breast cancer: Mammogram: There is disagreement between experts about when to start screening in low risk asymptomatic female but recent recommendations are to start screening at 47 and not later than 64 years old , every 1-2 years and after 64 yo q 2 years. Screening is recommended until 64 years old but some women can continue screening depending of healthy issues.   Colon cancer screening: starts at 64 years old until 64 years old.  Also  recommended:  1. Dental visit- Brush and floss your teeth twice daily; visit your dentist twice a year. 2. Eye doctor- Get an eye exam at least every 2 years. 3. Helmet use- Always wear a helmet when riding a bicycle, motorcycle, rollerblading or skateboarding. 4. Safe sex- If you may be exposed to sexually transmitted infections, use a condom. 5. Seat belts- Seat belts can save your live; always wear one. 6. Smoke/Carbon Monoxide detectors- These detectors need to be installed on the appropriate level of your home. Replace batteries at least once a year. 7. Skin cancer- When out in the sun please cover up and use sunscreen 15 SPF or higher. 8. Violence- If anyone is threatening or hurting you, please tell your healthcare provider.  9. Drink alcohol in moderation- Limit alcohol intake to one drink or less per day. Never drink and drive.

## 2019-08-16 NOTE — Progress Notes (Signed)
HPI:   [name] is a 63 y.o. female, who is here today for her routine physical.  Last CPE: 03/03/18  Regular exercise 3 or more time per week: Not consistently. Following a healthful diet: For the past week or so she decreased carb intake, she has lost about 5 Lb. She lives with her husband.  Chronic medical problems: Vit D def,HLD,allergies,fatigue, and asthma among some.  Pap smear: 04/2017 negative. She follows with gyn regularly.   Immunization History  Administered Date(s) Administered  . Influenza Split 07/28/2013  . Influenza Whole 08/11/2007, 07/28/2009  . Influenza-Unspecified 07/28/2014, 07/14/2017  . Pneumococcal Polysaccharide-23 03/26/2016  . Td 08/26/2005  . Tdap 02/26/2016  . Zoster 03/06/2011  . Zoster Recombinat (Shingrix) 10/02/2017, 03/03/2018    Mammogram: 07/20/19 Colonoscopy: 09/02/2018, Dr Earlean Shawl.  Hep C screening: 02/2016 NR.  Concerned about BP reading today. Hom BP 120's/70's  HLD: She is on Crestor 20 mg daily. She is following low fat diet.  Vit D def,she takes vit D 2000 U daily.   Component     Latest Ref Rng & Units 01/12/2018  Cholesterol, Total     100 - 199 mg/dL 169  Triglycerides     0.0 - 149.0 mg/dL 168 (H)  HDL Cholesterol     >39.00 mg/dL 56  VLDL Cholesterol Cal     5 - 40 mg/dL 34  LDL (calc)     0 - 99 mg/dL 79  Total CHOL/HDL Ratio      3.0   Review of Systems  Constitutional: Negative for appetite change, unusual fatigue and fever.  HENT: Negative for dental problem, hearing loss, mouth sores, sore throat, trouble swallowing and voice change.   Eyes: Negative for redness and visual disturbance.  Respiratory: Negative for cough, shortness of breath and wheezing.   Cardiovascular: Negative for chest pain and leg swelling.  Gastrointestinal: Negative for abdominal pain, nausea and vomiting.       No changes in bowel habits.  Endocrine: Negative for cold intolerance, heat intolerance,  polydipsia, polyphagia and polyuria.  Genitourinary: Negative for decreased urine volume, dysuria, hematuria, vaginal bleeding and vaginal discharge.  Musculoskeletal: Positive for hand arthralgias, No gait problem or myalgias.  Skin: Negative for color change and rash.  Allergic/Immunologic: Negative for environmental allergies.  Neurological: Negative for syncope, weakness and headaches.  Hematological: Negative for adenopathy. Does not bruise/bleed easily.  Psychiatric/Behavioral: Negative for confusion and sleep disturbance. The patient is nervous/anxious.   All other systems reviewed and are negative.   Current Outpatient Medications on File Prior to Visit  Medication Sig Dispense Refill  . Cholecalciferol (VITAMIN D3 PO) Take 2,000 Units by mouth daily.     . Dust Mite Mixed Allergen Ext (ODACTRA) 12 SQ-HDM SUBL Place under the tongue daily.    Marland Kitchen EPINEPHrine 0.3 mg/0.3 mL IJ SOAJ injection Inject 0.3 mLs (0.3 mg total) into the muscle as needed for anaphylaxis. 1 Device 1  . fluticasone (FLONASE) 50 MCG/ACT nasal spray Place into both nostrils daily.    . metoprolol tartrate (LOPRESSOR) 25 MG tablet Take as needed for palpitations 30 tablet 6  . rosuvastatin (CRESTOR) 20 MG tablet TAKE 1 TABLET(20 MG) BY MOUTH DAILY 90 tablet 2   No current facility-administered medications on file prior to visit.     Past Medical History:  Diagnosis Date  . Allergic rhinitis   . Asthma    in the past/ no meds  . Hyperlipidemia   . Osteoporosis  Past Surgical History:  Procedure Laterality Date  . BREAST REDUCTION SURGERY     Bil/  . CHOLECYSTECTOMY    . FOOT SURGERY  2018   left foot surgery to shorten toe and remove damaged nerves/ still have pain/Dr Hewitt  . valve in urteter     to control urine reflux/ age 74  . WISDOM TOOTH EXTRACTION      Allergies  Allergen Reactions  . Aspirin     REACTION: bruises very easily  . Dairy Aid [Lactase]   . Codeine     itching  .  Voltaren [Diclofenac]     Voltaren gel- makes her feel crazy    Family History  Problem Relation Age of Onset  . Stroke Mother   . Kidney disease Mother   . Diabetes Mother   . Alcohol abuse Father   . Heart disease Father   . Heart disease Brother 33  . Drug abuse Brother   . Hyperlipidemia Other     Social History   Socioeconomic History  . Marital status: Married    Spouse name: Not on file  . Number of children: Not on file  . Years of education: Not on file  . Highest education level: Not on file  Occupational History  . Not on file  Social Needs  . Financial resource strain: Not on file  . Food insecurity    Worry: Not on file    Inability: Not on file  . Transportation needs    Medical: Not on file    Non-medical: Not on file  Tobacco Use  . Smoking status: Never Smoker  . Smokeless tobacco: Never Used  Substance and Sexual Activity  . Alcohol use: Yes    Comment: social  . Drug use: No  . Sexual activity: Not on file  Lifestyle  . Physical activity    Days per week: Not on file    Minutes per session: Not on file  . Stress: Not on file  Relationships  . Social Herbalist on phone: Not on file    Gets together: Not on file    Attends religious service: Not on file    Active member of club or organization: Not on file    Attends meetings of clubs or organizations: Not on file    Relationship status: Not on file  Other Topics Concern  . Not on file  Social History Narrative  . Not on file     Vitals:   08/16/19 1006  BP: 140/80  Pulse: 78  Resp: 12  Temp: (!) 97 F (36.1 C)  SpO2: 98%   Body mass index is 24.37 kg/m.   Wt Readings from Last 3 Encounters:  08/16/19 137 lb 9.6 oz (62.4 kg)  06/28/19 145 lb (65.8 kg)  12/07/18 140 lb 2 oz (63.6 kg)    Physical Exam  Nursing note and vitals reviewed. Constitutional: She is oriented to person, place, and time. She appears well-developed and well-nourished. No distress.   HENT:  Head: Normocephalic and atraumatic.  Right Ear: Hearing, tympanic membrane, external ear and ear canal normal.  Left Ear: Hearing, tympanic membrane, external ear and ear canal normal.  Mouth/Throat: Uvula is midline, oropharynx is clear and moist and mucous membranes are normal.  Eyes: Pupils are equal, round, and reactive to light. Conjunctivae and EOM are normal.  Neck: No tracheal deviation present. No thyromegaly present.  Cardiovascular: Normal rate and regular rhythm.  No murmur heard. Pulses:  Dorsalis pedis pulses are 2+ on the right side, and 2+ on the left side.  Respiratory: Effort normal and breath sounds normal. No respiratory distress.  GI: Soft. She exhibits no mass. There is no hepatomegaly. There is no tenderness.  Genitourinary:Comments: Deferred to gyn.  Musculoskeletal: She exhibits no edema.  No major deformity or signs of synovitis appreciated.  Lymphadenopathy:    She has no cervical adenopathy.       Right: No supraclavicular adenopathy present.       Left: No supraclavicular adenopathy present.  Neurological: She is alert and oriented to person, place, and time. She has normal strength. No cranial nerve deficit. Coordination and gait normal.  Reflex Scores:      Bicep reflexes are 2+ on the right side and 2+ on the left side.      Patellar reflexes are 2+ on the right side and 2+ on the left side. Skin: Skin is warm. No rash noted. No erythema.  Psychiatric: She has a normal mood and affect. Cognitive function grossly intact. Well groomed, good eye contact.    ASSESSMENT AND PLAN:  Hannah [name] was here today annual physical examination.   Orders Placed This Encounter  Procedures  . Lipid panel  . Hemoglobin A1c  . Comprehensive metabolic panel  . VITAMIN D 25 Hydroxy (Vit-D Deficiency, Fractures)   Lab Results  Component Value Date   CREATININE 0.68 08/16/2019   BUN 17 08/16/2019   NA 138 08/16/2019   K 4.5 08/16/2019    CL 100 08/16/2019   CO2 27 08/16/2019    Lab Results  Component Value Date   HGBA1C 6.2 08/16/2019    Lab Results  Component Value Date   ALT 20 08/16/2019   AST 26 08/16/2019   ALKPHOS 72 08/16/2019   BILITOT 0.7 08/16/2019    Routine general medical examination at a health care facility We discussed the importance of regular physical activity and healthy diet for prevention of chronic illness and/or complications. Preventive guidelines reviewed. Vaccination up to date. She will continue following with gynecologist for her female preventive care.  Ca++ and vit D supplementation to continue. Next CPE in a year.  SBP mildly elevated, home BP's 120's/70. Will re-check in 6 months, continue monitoring BP at home.  Vitamin D deficiency Continue vit D 2000 U daily. Vit D dose  will be adjusted according to 25 OH vit D.  -     VITAMIN D 25 Hydroxy (Vit-D Deficiency, Fractures)  Hyperlipidemia, unspecified hyperlipidemia type No changes in current management, will follow labs done today and will give further recommendations accordingly.  -     Lipid panel  Screening for endocrine, metabolic and immunity disorder -     Hemoglobin A1c -     Comprehensive metabolic panel    Return in 6 months (on 02/14/2020) for BP.     G. Martinique, MD  [facility]. [place] office.

## 2019-08-25 ENCOUNTER — Telehealth: Payer: Self-pay | Admitting: Family Medicine

## 2019-08-25 NOTE — Telephone Encounter (Signed)
Patient calling to inquire if she can have MMR vaccine "to help prevent covid". She states that she has a friend "close to dr. Brenton Grills" who she states recommended it. Patient is requesting a call back with approval/ set up appointment. Please advise.

## 2019-08-25 NOTE — Telephone Encounter (Signed)
Pt called back to add that another reason she needs the MMR is her grandson from [place] has come to [place] and he has an autoimmune disease

## 2019-08-26 ENCOUNTER — Encounter: Payer: Self-pay | Admitting: Family Medicine

## 2019-08-30 NOTE — Telephone Encounter (Signed)
[employer] for MMR vaccine?

## 2019-08-31 NOTE — Telephone Encounter (Signed)
Pt stated she has yet to receive a reply to her [facility] message. Pt requests call back

## 2019-09-03 NOTE — Telephone Encounter (Signed)
Yes,it is ok to have MMR vaccine as requested. Thanks, Hannah [name]

## 2019-09-03 NOTE — Telephone Encounter (Signed)
Patient aware she can receive MMR vaccine via MyChart

## 2019-09-10 ENCOUNTER — Other Ambulatory Visit: Payer: Self-pay

## 2019-09-10 DIAGNOSIS — Z20822 Contact with and (suspected) exposure to covid-19: Secondary | ICD-10-CM

## 2019-09-13 LAB — NOVEL CORONAVIRUS, NAA: SARS-CoV-2, NAA: NOT DETECTED

## 2019-09-14 ENCOUNTER — Telehealth: Payer: Self-pay | Admitting: *Deleted

## 2019-09-14 NOTE — Telephone Encounter (Signed)
Patient called and was given NEGATIVE covid results.

## 2019-10-14 ENCOUNTER — Ambulatory Visit: Payer: BC Managed Care – PPO | Attending: Internal Medicine

## 2019-10-14 DIAGNOSIS — Z20828 Contact with and (suspected) exposure to other viral communicable diseases: Secondary | ICD-10-CM | POA: Diagnosis not present

## 2019-10-14 DIAGNOSIS — Z20822 Contact with and (suspected) exposure to covid-19: Secondary | ICD-10-CM

## 2019-10-16 LAB — NOVEL CORONAVIRUS, NAA: SARS-CoV-2, NAA: NOT DETECTED

## 2019-10-20 DIAGNOSIS — J209 Acute bronchitis, unspecified: Secondary | ICD-10-CM | POA: Diagnosis not present

## 2019-10-20 DIAGNOSIS — Z20828 Contact with and (suspected) exposure to other viral communicable diseases: Secondary | ICD-10-CM | POA: Diagnosis not present

## 2019-11-09 ENCOUNTER — Other Ambulatory Visit: Payer: Self-pay | Admitting: Family Medicine

## 2019-11-09 NOTE — Telephone Encounter (Signed)
Medication Refill - Medication: EPINEPHrine 0.3 mg/0.3 mL IJ SOAJ injection   Has the patient contacted their pharmacy? No. (Agent: If no, request that the patient contact the pharmacy for the refill.) (Agent: If yes, when and what did the pharmacy advise?)  Preferred Pharmacy (with phone number or street name):  [facility] [place], [place] Phone:  [phone]  Fax:  [phone]       Agent: Please be advised that RX refills may take up to 3 business days. We ask that you follow-up with your pharmacy.

## 2019-11-15 ENCOUNTER — Encounter: Payer: Self-pay | Admitting: Family Medicine

## 2019-11-15 MED ORDER — EPINEPHRINE 0.3 MG/0.3ML IJ SOAJ: 0.3000 mg | INTRAMUSCULAR | 1 refills | Status: AC | PRN

## 2019-12-28 ENCOUNTER — Ambulatory Visit: Payer: BC Managed Care – PPO | Attending: Internal Medicine

## 2019-12-28 DIAGNOSIS — Z23 Encounter for immunization: Secondary | ICD-10-CM

## 2019-12-28 NOTE — Progress Notes (Signed)
   Covid-19 Vaccination Clinic  Name:  CIA SHARF    MRN: [id number] DOB: [birth year]  12/28/2019  Ms. Violett was observed post Covid-19 immunization for 30 minutes based on pre-vaccination screening without incident. She was provided with Vaccine Information Sheet and instruction to access the V-Safe system.   Ms. Torsiello was instructed to call 911 with any severe reactions post vaccine: Marland Kitchen Difficulty breathing  . Swelling of face and throat  . A fast heartbeat  . A bad rash all over body  . Dizziness and weakness   Immunizations Administered    Name Date Dose VIS Date Route   Pfizer COVID-19 Vaccine 12/28/2019  5:08 PM 0.3 mL 10/08/2019 Intramuscular   Manufacturer: [place]   Lot: [id number]   [place]: [id number]

## 2020-01-18 ENCOUNTER — Ambulatory Visit: Payer: BC Managed Care – PPO | Attending: Internal Medicine

## 2020-01-18 DIAGNOSIS — Z23 Encounter for immunization: Secondary | ICD-10-CM

## 2020-01-18 NOTE — Progress Notes (Signed)
   Covid-19 Vaccination Clinic  Name:  NIKEA WOLTER    MRN: [id number] DOB: Mar 17, 1956  01/18/2020  Ms. Lorton was observed post Covid-19 immunization for 30 minutes based on pre-vaccination screening without incident. She was provided with Vaccine Information Sheet and instruction to access the V-Safe system.   Ms. Hays was instructed to call 911 with any severe reactions post vaccine: Marland Kitchen Difficulty breathing  . Swelling of face and throat  . A fast heartbeat  . A bad rash all over body  . Dizziness and weakness   Immunizations Administered    Name Date Dose VIS Date Route   Pfizer COVID-19 Vaccine 01/18/2020  8:52 AM 0.3 mL 10/08/2019 Intramuscular   Manufacturer: [place]   Lot: [id number]   [place]: [id number]

## 2020-01-19 ENCOUNTER — Encounter: Payer: Self-pay | Admitting: Family Medicine

## 2020-01-25 ENCOUNTER — Ambulatory Visit: Payer: BC Managed Care – PPO

## 2020-02-28 DIAGNOSIS — J3089 Other allergic rhinitis: Secondary | ICD-10-CM | POA: Diagnosis not present

## 2020-02-28 DIAGNOSIS — Z91030 Bee allergy status: Secondary | ICD-10-CM | POA: Diagnosis not present

## 2020-02-28 DIAGNOSIS — J301 Allergic rhinitis due to pollen: Secondary | ICD-10-CM | POA: Diagnosis not present

## 2020-02-28 DIAGNOSIS — H1045 Other chronic allergic conjunctivitis: Secondary | ICD-10-CM | POA: Diagnosis not present

## 2020-03-13 DIAGNOSIS — H2513 Age-related nuclear cataract, bilateral: Secondary | ICD-10-CM | POA: Diagnosis not present

## 2020-03-13 DIAGNOSIS — H5310 Unspecified subjective visual disturbances: Secondary | ICD-10-CM | POA: Diagnosis not present

## 2020-03-28 ENCOUNTER — Other Ambulatory Visit: Payer: Self-pay | Admitting: Cardiovascular Disease

## 2020-03-29 DIAGNOSIS — Z6824 Body mass index (BMI) 24.0-24.9, adult: Secondary | ICD-10-CM | POA: Diagnosis not present

## 2020-03-29 DIAGNOSIS — Z01419 Encounter for gynecological examination (general) (routine) without abnormal findings: Secondary | ICD-10-CM | POA: Diagnosis not present

## 2020-04-10 ENCOUNTER — Encounter: Payer: Self-pay | Admitting: Family Medicine

## 2020-04-10 NOTE — Telephone Encounter (Signed)
The patient called to ask you question about medical records.  Please advise

## 2020-04-18 DIAGNOSIS — Z1331 Encounter for screening for depression: Secondary | ICD-10-CM | POA: Diagnosis not present

## 2020-04-18 DIAGNOSIS — E7849 Other hyperlipidemia: Secondary | ICD-10-CM | POA: Diagnosis not present

## 2020-04-18 DIAGNOSIS — R82998 Other abnormal findings in urine: Secondary | ICD-10-CM | POA: Diagnosis not present

## 2020-04-18 DIAGNOSIS — R739 Hyperglycemia, unspecified: Secondary | ICD-10-CM | POA: Diagnosis not present

## 2020-06-13 DIAGNOSIS — L814 Other melanin hyperpigmentation: Secondary | ICD-10-CM | POA: Diagnosis not present

## 2020-06-13 DIAGNOSIS — D692 Other nonthrombocytopenic purpura: Secondary | ICD-10-CM | POA: Diagnosis not present

## 2020-06-13 DIAGNOSIS — L298 Other pruritus: Secondary | ICD-10-CM | POA: Diagnosis not present

## 2020-06-13 DIAGNOSIS — L821 Other seborrheic keratosis: Secondary | ICD-10-CM | POA: Diagnosis not present

## 2020-07-25 DIAGNOSIS — Z1231 Encounter for screening mammogram for malignant neoplasm of breast: Secondary | ICD-10-CM | POA: Diagnosis not present

## 2020-08-10 DIAGNOSIS — R739 Hyperglycemia, unspecified: Secondary | ICD-10-CM | POA: Diagnosis not present

## 2020-08-10 DIAGNOSIS — E785 Hyperlipidemia, unspecified: Secondary | ICD-10-CM | POA: Diagnosis not present

## 2020-08-10 DIAGNOSIS — E559 Vitamin D deficiency, unspecified: Secondary | ICD-10-CM | POA: Diagnosis not present

## 2020-08-10 DIAGNOSIS — Z0000 Encounter for general adult medical examination without abnormal findings: Secondary | ICD-10-CM | POA: Diagnosis not present

## 2020-08-17 DIAGNOSIS — M79644 Pain in right finger(s): Secondary | ICD-10-CM | POA: Diagnosis not present

## 2020-08-17 DIAGNOSIS — Z1212 Encounter for screening for malignant neoplasm of rectum: Secondary | ICD-10-CM | POA: Diagnosis not present

## 2020-08-17 DIAGNOSIS — Z0000 Encounter for general adult medical examination without abnormal findings: Secondary | ICD-10-CM | POA: Diagnosis not present

## 2020-08-17 DIAGNOSIS — E785 Hyperlipidemia, unspecified: Secondary | ICD-10-CM | POA: Diagnosis not present

## 2020-09-08 ENCOUNTER — Telehealth: Payer: BC Managed Care – PPO | Admitting: Physician Assistant

## 2020-09-18 ENCOUNTER — Telehealth (INDEPENDENT_AMBULATORY_CARE_PROVIDER_SITE_OTHER): Payer: BC Managed Care – PPO | Admitting: Physician Assistant

## 2020-09-18 ENCOUNTER — Encounter: Payer: Self-pay | Admitting: Physician Assistant

## 2020-09-18 VITALS — Ht 63.5 in | Wt 143.0 lb

## 2020-09-18 DIAGNOSIS — E785 Hyperlipidemia, unspecified: Secondary | ICD-10-CM

## 2020-09-18 DIAGNOSIS — R002 Palpitations: Secondary | ICD-10-CM

## 2020-09-18 NOTE — Patient Instructions (Signed)
Medication Instructions:  Continue current medications  *If you need a refill on your cardiac medications before your next appointment, please call your pharmacy*   Lab Work: None Ordered   Testing/Procedures: None ordered   Follow-Up: At [facility], you and your health needs are our priority.  As part of our continuing mission to provide you with exceptional heart care, we have created designated Provider Care Teams.  These Care Teams include your primary Cardiologist (physician) and Advanced Practice Providers (APPs -  Physician Assistants and Nurse Practitioners) who all work together to provide you with the care you need, when you need it.  We recommend signing up for the patient portal called "MyChart".  Sign up information is provided on this After Visit Summary.  MyChart is used to connect with patients for Virtual Visits (Telemedicine).  Patients are able to view lab/test results, encounter notes, upcoming appointments, etc.  Non-urgent messages can be sent to your provider as well.   To learn more about what you can do with MyChart, go to [email or web address].    Your next appointment:   1 year(s)  The format for your next appointment:   In Person  Provider:   You may see Justyn Boyson Kelly, MD or one of the following Advanced Practice Providers on your designated Care Team:    Hao Meng, PA-C  Angela Duke, PA-C or   Krista Kroeger, PA-C    

## 2020-09-18 NOTE — Progress Notes (Signed)
Virtual Visit via Telephone Note   This visit type was conducted due to national recommendations for restrictions regarding the COVID-19 Pandemic (e.g. social distancing) in an effort to limit this patient's exposure and mitigate transmission in our community.  Due to her co-morbid illnesses, this patient is at least at moderate risk for complications without adequate follow up.  This format is felt to be most appropriate for this patient at this time.  The patient did not have access to video technology/had technical difficulties with video requiring transitioning to audio format only (telephone).  All issues noted in this document were discussed and addressed.  No physical exam could be performed with this format.  Please refer to the patient's chart for her  consent to telehealth for [facility].    Date:  09/18/2020   ID:  Hannah [name], DOB Mar 24, 1956, MRN [id number] The patient was identified using 2 identifiers.  Patient Location: Home Provider Location: Office/Clinic  PCP:  Michael Boston, MD  Cardiologist:  Shelva Majestic, MD  Electrophysiologist:  None   Evaluation Performed:  Follow-Up Visit  Chief Complaint:  Follow up  History of Present Illness:    Hannah [name] is a 64 y.o. female with minimal carotid artery disease, hyperlipidemia, palpitation and history of osteoporosis.  Patient has strong family history of CAD in her parents.  She was previously followed by Dr. Verl Blalock for her cardiac care.  Echocardiogram in June 2018 showed normal LV function, grade 2 DD.  According to the previous report, she has a history of easy bruisability, however has never been evaluated for coagulation disorder.  In January 2020, she developed anaphylactic reactions after eating blueberries with walnuts.  She was given EpiPen.  Patient presents today for virtual visit.  She denies any chest pain however does have slight increase of dyspnea on exertion which she attributed to weight  gain.  Recent blood work obtained by PCP has been reviewed.  Her triglyceride is borderline elevated, she will adjust her dietary intake.  If it continues to be elevated in the future, we will consider over-the-counter fish oil.  Overall, she has been doing well and has not used any of her PRN metoprolol.  She did occasionally feel some palpitation, however they are all quite transient.   The patient does not have symptoms concerning for COVID-19 infection (fever, chills, cough, or new shortness of breath).    Past Medical History:  Diagnosis Date  . Allergic rhinitis   . Asthma    in the past/ no meds  . Hyperlipidemia   . Osteoporosis    Past Surgical History:  Procedure Laterality Date  . BREAST REDUCTION SURGERY     Bil/  . CHOLECYSTECTOMY    . FOOT SURGERY  2018   left foot surgery to shorten toe and remove damaged nerves/ still have pain/Dr Hewitt  . valve in urteter     to control urine reflux/ age 73  . WISDOM TOOTH EXTRACTION       Current Meds  Medication Sig  . Cholecalciferol (VITAMIN D3 PO) Take 2,000 Units by mouth daily.   . Dust Mite Mixed Allergen Ext (ODACTRA) 12 SQ-HDM SUBL Place under the tongue daily.  Marland Kitchen EPINEPHrine 0.3 mg/0.3 mL IJ SOAJ injection Inject 0.3 mLs (0.3 mg total) into the muscle as needed for anaphylaxis.  . fluticasone (FLONASE) 50 MCG/ACT nasal spray Place into both nostrils daily.  . metoprolol tartrate (LOPRESSOR) 25 MG tablet Take as needed for palpitations  .  rosuvastatin (CRESTOR) 20 MG tablet TAKE 1 TABLET(20 MG) BY MOUTH DAILY     Allergies:   Aspirin, Dairy aid [lactase], Codeine, and Voltaren [diclofenac]   Social History   Tobacco Use  . Smoking status: Never Smoker  . Smokeless tobacco: Never Used  Substance Use Topics  . Alcohol use: Yes    Comment: social  . Drug use: No     Family Hx: The patient's family history includes Alcohol abuse in her father; Diabetes in her mother; Drug abuse in her brother; Heart disease in  her father; Heart disease (age of onset: 49) in her brother; Hyperlipidemia in an other family member; Kidney disease in her mother; Stroke in her mother.  ROS:   Please see the history of present illness.     All other systems reviewed and are negative.   Prior CV studies:   The following studies were reviewed today:  Echo 04/04/2017 LV EF: 60% -  65%   -------------------------------------------------------------------  Indications:   R00.2 Palpitations.   -------------------------------------------------------------------  History:  PMH: Acquired from the patient and from the patient&'s  chart. Risk factors: Dyslipidemia.   -------------------------------------------------------------------  Study Conclusions   - Left ventricle: The cavity size was normal. Wall thickness was  normal. Systolic function was normal. The estimated ejection  fraction was in the range of 60% to 65%. Wall motion was normal;  there were no regional wall motion abnormalities. Features are  consistent with a pseudonormal left ventricular filling pattern,  with concomitant abnormal relaxation and increased filling  pressure (grade 2 diastolic dysfunction).   Labs/Other Tests and Data Reviewed:    EKG:  An ECG dated 06/28/2019 was personally reviewed today and demonstrated:  Sinus rhythm without significant ST-T wave changes.  Recent Labs: No results found for requested labs within last 8760 hours.   Recent Lipid Panel Lab Results  Component Value Date/Time   CHOL 185 08/16/2019 10:47 AM   CHOL 169 01/12/2018 08:25 AM   TRIG 192.0 (H) 08/16/2019 10:47 AM   HDL 43.20 08/16/2019 10:47 AM   HDL 56 01/12/2018 08:25 AM   CHOLHDL 4 08/16/2019 10:47 AM   LDLCALC 104 (H) 08/16/2019 10:47 AM   LDLCALC 79 01/12/2018 08:25 AM   LDLDIRECT 183.0 07/24/2015 08:08 AM    Wt Readings from Last 3 Encounters:  09/18/20 143 lb (64.9 kg)  08/16/19 137 lb 9.6 oz (62.4 kg)  06/28/19 145 lb  (65.8 kg)     Risk Assessment/Calculations:      Objective:    Vital Signs:  Ht 5' 3.5" (1.613 m)   Wt 143 lb (64.9 kg)   BMI 24.93 kg/m   124/68  Hr 84  VITAL SIGNS:  reviewed  ASSESSMENT & PLAN:    1. Palpitation: She has not needed the PRN metoprolol recently.  Continue on current therapy  2. Hyperlipidemia: Continue Crestor   Shared Decision Making/Informed Consent        COVID-19 Education: The signs and symptoms of COVID-19 were discussed with the patient and how to seek care for testing (follow up with PCP or arrange E-visit).  The importance of social distancing was discussed today.  Time:   Today, I have spent 5 minutes with the patient with telehealth technology discussing the above problems.     Medication Adjustments/Labs and Tests Ordered: Current medicines are reviewed at length with the patient today.  Concerns regarding medicines are outlined above.   Tests Ordered: No orders of the defined types were placed in this  encounter.   Medication Changes: No orders of the defined types were placed in this encounter.   Follow Up:  In Person in 1 year(s)  Signed, Almyra Deforest, [place]  09/18/2020 10:40 AM    [place]

## 2020-09-26 DIAGNOSIS — J3089 Other allergic rhinitis: Secondary | ICD-10-CM | POA: Diagnosis not present

## 2020-09-26 DIAGNOSIS — H1045 Other chronic allergic conjunctivitis: Secondary | ICD-10-CM | POA: Diagnosis not present

## 2020-09-26 DIAGNOSIS — J301 Allergic rhinitis due to pollen: Secondary | ICD-10-CM | POA: Diagnosis not present

## 2020-09-26 DIAGNOSIS — Z91030 Bee allergy status: Secondary | ICD-10-CM | POA: Diagnosis not present

## 2020-09-30 ENCOUNTER — Other Ambulatory Visit: Payer: Self-pay | Admitting: Cardiovascular Disease

## 2020-10-10 DIAGNOSIS — J019 Acute sinusitis, unspecified: Secondary | ICD-10-CM | POA: Diagnosis not present

## 2020-10-10 DIAGNOSIS — R051 Acute cough: Secondary | ICD-10-CM | POA: Diagnosis not present

## 2021-01-15 DIAGNOSIS — M7632 Iliotibial band syndrome, left leg: Secondary | ICD-10-CM | POA: Diagnosis not present

## 2021-02-13 ENCOUNTER — Other Ambulatory Visit: Payer: Self-pay | Admitting: Orthopedic Surgery

## 2021-02-13 DIAGNOSIS — G5762 Lesion of plantar nerve, left lower limb: Secondary | ICD-10-CM | POA: Diagnosis not present

## 2021-02-13 DIAGNOSIS — M25562 Pain in left knee: Secondary | ICD-10-CM

## 2021-02-13 DIAGNOSIS — M79672 Pain in left foot: Secondary | ICD-10-CM

## 2021-02-21 DIAGNOSIS — M7632 Iliotibial band syndrome, left leg: Secondary | ICD-10-CM | POA: Diagnosis not present

## 2021-02-21 DIAGNOSIS — M6281 Muscle weakness (generalized): Secondary | ICD-10-CM | POA: Diagnosis not present

## 2021-02-21 DIAGNOSIS — R262 Difficulty in walking, not elsewhere classified: Secondary | ICD-10-CM | POA: Diagnosis not present

## 2021-02-27 ENCOUNTER — Ambulatory Visit
Admission: RE | Admit: 2021-02-27 | Discharge: 2021-02-27 | Disposition: A | Discharge status: Home / Self Care | Payer: BC Managed Care – PPO | Source: Ambulatory Visit | Attending: Orthopedic Surgery | Admitting: Orthopedic Surgery

## 2021-02-27 ENCOUNTER — Other Ambulatory Visit: Payer: Self-pay

## 2021-02-27 DIAGNOSIS — M79672 Pain in left foot: Secondary | ICD-10-CM | POA: Diagnosis not present

## 2021-02-27 DIAGNOSIS — R600 Localized edema: Secondary | ICD-10-CM | POA: Diagnosis not present

## 2021-02-27 DIAGNOSIS — M25561 Pain in right knee: Secondary | ICD-10-CM | POA: Diagnosis not present

## 2021-02-27 DIAGNOSIS — M25562 Pain in left knee: Secondary | ICD-10-CM

## 2021-02-27 DIAGNOSIS — R200 Anesthesia of skin: Secondary | ICD-10-CM | POA: Diagnosis not present

## 2021-02-27 DIAGNOSIS — Z9889 Other specified postprocedural states: Secondary | ICD-10-CM | POA: Diagnosis not present

## 2021-03-01 DIAGNOSIS — M6281 Muscle weakness (generalized): Secondary | ICD-10-CM | POA: Diagnosis not present

## 2021-03-01 DIAGNOSIS — M7632 Iliotibial band syndrome, left leg: Secondary | ICD-10-CM | POA: Diagnosis not present

## 2021-03-01 DIAGNOSIS — R262 Difficulty in walking, not elsewhere classified: Secondary | ICD-10-CM | POA: Diagnosis not present

## 2021-03-02 DIAGNOSIS — M79672 Pain in left foot: Secondary | ICD-10-CM | POA: Diagnosis not present

## 2021-03-05 DIAGNOSIS — M79672 Pain in left foot: Secondary | ICD-10-CM | POA: Diagnosis not present

## 2021-03-05 DIAGNOSIS — M6281 Muscle weakness (generalized): Secondary | ICD-10-CM | POA: Diagnosis not present

## 2021-03-05 DIAGNOSIS — M25672 Stiffness of left ankle, not elsewhere classified: Secondary | ICD-10-CM | POA: Diagnosis not present

## 2021-03-08 DIAGNOSIS — M25672 Stiffness of left ankle, not elsewhere classified: Secondary | ICD-10-CM | POA: Diagnosis not present

## 2021-03-08 DIAGNOSIS — M6281 Muscle weakness (generalized): Secondary | ICD-10-CM | POA: Diagnosis not present

## 2021-03-08 DIAGNOSIS — M79672 Pain in left foot: Secondary | ICD-10-CM | POA: Diagnosis not present

## 2021-03-12 DIAGNOSIS — M79672 Pain in left foot: Secondary | ICD-10-CM | POA: Diagnosis not present

## 2021-03-12 DIAGNOSIS — M25672 Stiffness of left ankle, not elsewhere classified: Secondary | ICD-10-CM | POA: Diagnosis not present

## 2021-03-12 DIAGNOSIS — M6281 Muscle weakness (generalized): Secondary | ICD-10-CM | POA: Diagnosis not present

## 2021-03-14 DIAGNOSIS — M79672 Pain in left foot: Secondary | ICD-10-CM | POA: Diagnosis not present

## 2021-03-14 DIAGNOSIS — M6281 Muscle weakness (generalized): Secondary | ICD-10-CM | POA: Diagnosis not present

## 2021-03-14 DIAGNOSIS — M25672 Stiffness of left ankle, not elsewhere classified: Secondary | ICD-10-CM | POA: Diagnosis not present

## 2021-03-19 DIAGNOSIS — M7632 Iliotibial band syndrome, left leg: Secondary | ICD-10-CM | POA: Diagnosis not present

## 2021-03-19 DIAGNOSIS — R262 Difficulty in walking, not elsewhere classified: Secondary | ICD-10-CM | POA: Diagnosis not present

## 2021-03-19 DIAGNOSIS — M6281 Muscle weakness (generalized): Secondary | ICD-10-CM | POA: Diagnosis not present

## 2021-03-21 DIAGNOSIS — M79672 Pain in left foot: Secondary | ICD-10-CM | POA: Diagnosis not present

## 2021-03-21 DIAGNOSIS — M6281 Muscle weakness (generalized): Secondary | ICD-10-CM | POA: Diagnosis not present

## 2021-03-21 DIAGNOSIS — M25672 Stiffness of left ankle, not elsewhere classified: Secondary | ICD-10-CM | POA: Diagnosis not present

## 2021-04-02 DIAGNOSIS — M25672 Stiffness of left ankle, not elsewhere classified: Secondary | ICD-10-CM | POA: Diagnosis not present

## 2021-04-02 DIAGNOSIS — M79672 Pain in left foot: Secondary | ICD-10-CM | POA: Diagnosis not present

## 2021-04-02 DIAGNOSIS — M6281 Muscle weakness (generalized): Secondary | ICD-10-CM | POA: Diagnosis not present

## 2021-04-03 DIAGNOSIS — M6281 Muscle weakness (generalized): Secondary | ICD-10-CM | POA: Diagnosis not present

## 2021-04-03 DIAGNOSIS — R262 Difficulty in walking, not elsewhere classified: Secondary | ICD-10-CM | POA: Diagnosis not present

## 2021-04-03 DIAGNOSIS — M7632 Iliotibial band syndrome, left leg: Secondary | ICD-10-CM | POA: Diagnosis not present

## 2021-04-24 DIAGNOSIS — H5310 Unspecified subjective visual disturbances: Secondary | ICD-10-CM | POA: Diagnosis not present

## 2021-04-24 DIAGNOSIS — Z91030 Bee allergy status: Secondary | ICD-10-CM | POA: Diagnosis not present

## 2021-04-24 DIAGNOSIS — H1045 Other chronic allergic conjunctivitis: Secondary | ICD-10-CM | POA: Diagnosis not present

## 2021-04-24 DIAGNOSIS — J301 Allergic rhinitis due to pollen: Secondary | ICD-10-CM | POA: Diagnosis not present

## 2021-04-24 DIAGNOSIS — H2513 Age-related nuclear cataract, bilateral: Secondary | ICD-10-CM | POA: Diagnosis not present

## 2021-04-24 DIAGNOSIS — J3089 Other allergic rhinitis: Secondary | ICD-10-CM | POA: Diagnosis not present

## 2021-04-25 DIAGNOSIS — M25672 Stiffness of left ankle, not elsewhere classified: Secondary | ICD-10-CM | POA: Diagnosis not present

## 2021-04-25 DIAGNOSIS — M79672 Pain in left foot: Secondary | ICD-10-CM | POA: Diagnosis not present

## 2021-04-25 DIAGNOSIS — M6281 Muscle weakness (generalized): Secondary | ICD-10-CM | POA: Diagnosis not present

## 2021-07-09 DIAGNOSIS — D1801 Hemangioma of skin and subcutaneous tissue: Secondary | ICD-10-CM | POA: Diagnosis not present

## 2021-07-09 DIAGNOSIS — D225 Melanocytic nevi of trunk: Secondary | ICD-10-CM | POA: Diagnosis not present

## 2021-07-09 DIAGNOSIS — L821 Other seborrheic keratosis: Secondary | ICD-10-CM | POA: Diagnosis not present

## 2021-08-31 DIAGNOSIS — E559 Vitamin D deficiency, unspecified: Secondary | ICD-10-CM | POA: Diagnosis not present

## 2021-08-31 DIAGNOSIS — E785 Hyperlipidemia, unspecified: Secondary | ICD-10-CM | POA: Diagnosis not present

## 2021-08-31 DIAGNOSIS — R739 Hyperglycemia, unspecified: Secondary | ICD-10-CM | POA: Diagnosis not present

## 2021-08-31 DIAGNOSIS — Z0000 Encounter for general adult medical examination without abnormal findings: Secondary | ICD-10-CM | POA: Diagnosis not present

## 2021-09-05 ENCOUNTER — Other Ambulatory Visit: Payer: Self-pay | Admitting: Cardiovascular Disease

## 2021-09-07 DIAGNOSIS — Z1331 Encounter for screening for depression: Secondary | ICD-10-CM | POA: Diagnosis not present

## 2021-09-07 DIAGNOSIS — Z1339 Encounter for screening examination for other mental health and behavioral disorders: Secondary | ICD-10-CM | POA: Diagnosis not present

## 2021-09-07 DIAGNOSIS — R200 Anesthesia of skin: Secondary | ICD-10-CM | POA: Diagnosis not present

## 2021-09-07 DIAGNOSIS — Z0000 Encounter for general adult medical examination without abnormal findings: Secondary | ICD-10-CM | POA: Diagnosis not present

## 2021-09-07 DIAGNOSIS — Z23 Encounter for immunization: Secondary | ICD-10-CM | POA: Diagnosis not present

## 2021-09-07 DIAGNOSIS — G4700 Insomnia, unspecified: Secondary | ICD-10-CM | POA: Diagnosis not present

## 2021-09-11 DIAGNOSIS — E538 Deficiency of other specified B group vitamins: Secondary | ICD-10-CM | POA: Diagnosis not present

## 2021-09-18 DIAGNOSIS — E538 Deficiency of other specified B group vitamins: Secondary | ICD-10-CM | POA: Diagnosis not present

## 2021-09-19 DIAGNOSIS — N958 Other specified menopausal and perimenopausal disorders: Secondary | ICD-10-CM | POA: Diagnosis not present

## 2021-09-19 DIAGNOSIS — M8588 Other specified disorders of bone density and structure, other site: Secondary | ICD-10-CM | POA: Diagnosis not present

## 2021-09-25 DIAGNOSIS — E538 Deficiency of other specified B group vitamins: Secondary | ICD-10-CM | POA: Diagnosis not present

## 2021-10-01 DIAGNOSIS — Z01419 Encounter for gynecological examination (general) (routine) without abnormal findings: Secondary | ICD-10-CM | POA: Diagnosis not present

## 2021-10-01 DIAGNOSIS — Z6824 Body mass index (BMI) 24.0-24.9, adult: Secondary | ICD-10-CM | POA: Diagnosis not present

## 2021-10-02 DIAGNOSIS — E538 Deficiency of other specified B group vitamins: Secondary | ICD-10-CM | POA: Diagnosis not present

## 2021-10-06 DIAGNOSIS — Z1231 Encounter for screening mammogram for malignant neoplasm of breast: Secondary | ICD-10-CM | POA: Diagnosis not present

## 2021-10-08 NOTE — Progress Notes (Deleted)
Cardiology Office Note   Date:  10/08/2021   ID:  Hannah [name], DOB 09-11-1956, MRN [id number]  PCP:  Michael Boston, MD  Cardiologist:  Dr. Claiborne Billings  No chief complaint on file.    History of Present Illness: Hannah [name] is a 65 y.o. female who presents for ongoing assessment and management of minimal carotid artery disease, hyperlipidemia, palpitations, with history of osteoporosis and strong family history of CAD in parents.  Echocardiogram June 2018 showed normal LV function with grade 2 diastolic dysfunction.  She was seen last by Almyra Deforest, PA, on 09/18/2020 via virtual visit, at which time she complained of some mild dyspnea on exertion which she attributed to weight gain.  No changes were made in her medication regimen.  She was to continue as needed metoprolol and Crestor as directed.  Past Medical History:  Diagnosis Date   Allergic rhinitis    Asthma    in the past/ no meds   Hyperlipidemia    Osteoporosis     Past Surgical History:  Procedure Laterality Date   BREAST REDUCTION SURGERY     Bil/   CHOLECYSTECTOMY     FOOT SURGERY  2018   left foot surgery to shorten toe and remove damaged nerves/ still have pain/Dr Hewitt   valve in urteter     to control urine reflux/ age 68   WISDOM TOOTH EXTRACTION       Current Outpatient Medications  Medication Sig Dispense Refill   Cholecalciferol (VITAMIN D3 PO) Take 2,000 Units by mouth daily.      Dust Mite Mixed Allergen Ext (ODACTRA) 12 SQ-HDM SUBL Place under the tongue daily.     EPINEPHrine 0.3 mg/0.3 mL IJ SOAJ injection Inject 0.3 mLs (0.3 mg total) into the muscle as needed for anaphylaxis. 2 each 1   fluticasone (FLONASE) 50 MCG/ACT nasal spray Place into both nostrils daily.     metoprolol tartrate (LOPRESSOR) 25 MG tablet Take as needed for palpitations 30 tablet 6   rosuvastatin (CRESTOR) 20 MG tablet TAKE 1 TABLET(20 MG) BY MOUTH DAILY 90 tablet 3   No current facility-administered medications for  this visit.    Allergies:   Aspirin, Dairy aid [tilactase], Codeine, and Voltaren [diclofenac]    Social History:  The patient  reports that she has never smoked. She has never used smokeless tobacco. She reports current alcohol use. She reports that she does not use drugs.   Family History:  The patient's family history includes Alcohol abuse in her father; Diabetes in her mother; Drug abuse in her brother; Heart disease in her father; Heart disease (age of onset: 60) in her brother; Hyperlipidemia in an other family member; Kidney disease in her mother; Stroke in her mother.    ROS: All other systems are reviewed and negative. Unless otherwise mentioned in H&P    PHYSICAL EXAM: VS:  There were no vitals taken for this visit. , BMI There is no height or weight on file to calculate BMI. GEN: Well nourished, well developed, in no acute distress HEENT: normal Neck: no JVD, carotid bruits, or masses Cardiac: ***RRR; no murmurs, rubs, or gallops,no edema  Respiratory:  Clear to auscultation bilaterally, normal work of breathing GI: soft, nontender, nondistended, + BS MS: no deformity or atrophy Skin: warm and dry, no rash Neuro:  Strength and sensation are intact Psych: euthymic mood, full affect   EKG:  EKG {ACTION; IS/IS [id number]} ordered today. The ekg ordered today demonstrates ***  Recent Labs: No results found for requested labs within last 8760 hours.    Lipid Panel    Component Value Date/Time   CHOL 185 08/16/2019 1047   CHOL 169 01/12/2018 0825   TRIG 192.0 (H) 08/16/2019 1047   HDL 43.20 08/16/2019 1047   HDL 56 01/12/2018 0825   CHOLHDL 4 08/16/2019 1047   VLDL 38.4 08/16/2019 1047   LDLCALC 104 (H) 08/16/2019 1047   LDLCALC 79 01/12/2018 0825   LDLDIRECT 183.0 07/24/2015 0808      Wt Readings from Last 3 Encounters:  09/18/20 143 lb (64.9 kg)  08/16/19 137 lb 9.6 oz (62.4 kg)  06/28/19 145 lb (65.8 kg)      Other studies  Reviewed: Echocardiogram 06/04/2017 Left ventricle: The cavity size was normal. Wall thickness was    normal. Systolic function was normal. The estimated ejection    fraction was in the range of 60% to 65%. Wall motion was normal;    there were no regional wall motion abnormalities. Features are    consistent with a pseudonormal left ventricular filling pattern,    with concomitant abnormal relaxation and increased filling    pressure (grade 2 diastolic dysfunction).   ASSESSMENT AND PLAN:  1.  ***   Current medicines are reviewed at length with the patient today.  I have spent *** dedicated to the care of this patient on the date of this encounter to include pre-visit review of records, assessment, management and diagnostic testing,with shared decision making.  Labs/ tests ordered today include: *** Phill Myron. West Pugh, ANP, [facility]   10/08/2021 8:35 AM    [facility] [address] Office [phone] Fax [phone]  Notice: This dictation was prepared with Dragon dictation along with smaller phrase technology. Any transcriptional errors that result from this process are unintentional and may not be corrected upon review.

## 2021-10-09 DIAGNOSIS — E538 Deficiency of other specified B group vitamins: Secondary | ICD-10-CM | POA: Diagnosis not present

## 2021-10-12 ENCOUNTER — Ambulatory Visit: Payer: BC Managed Care – PPO | Admitting: Adult Health

## 2021-10-16 DIAGNOSIS — E538 Deficiency of other specified B group vitamins: Secondary | ICD-10-CM | POA: Diagnosis not present

## 2021-10-30 ENCOUNTER — Ambulatory Visit: Payer: BC Managed Care – PPO | Admitting: Cardiovascular Disease

## 2021-11-14 NOTE — Progress Notes (Deleted)
Cardiology Office Note:    Date:  11/14/2021   ID:  Hannah [name], DOB 03-Jan-1956, MRN [id number]  PCP:  Michael Boston, MD  Cardiologist:  Shelva Majestic, MD  Electrophysiologist:  None   Referring MD: Michael Boston, MD   Chief Complaint: follow-up of palpitation  History of Present Illness:    Hannah [name] is a 66 y.o. female with a history of palpitations with rare PVCs noted on outpatient monitor in 2018, minimal bilateral carotid stenosis on dopplers in 2011, and hyperlipidemia as well as a strong family history of CAD in her parents who is followed by Dr. Claiborne Billings and presents today for routine follow-up of palpitations.  Patient previously followed by Dr. Verl Blalock and now sent by Dr. Claiborne Billings. It does not look like patient has ever had an ischemic evaluation.  Last Echo in June 2018 LVEF of 60-65% with normal wall motion and grade 2 diastolic dysfunction.  Patient monitor in June/July 2018 for further evaluation of palpitations showed underlying sinus rhythm with rare PVCs but no atrial fibrillation/flutter or SVT.  She was last seen by Almyra Deforest, PA-C, for a virtual visit in 08/2020 at which time she reported slight increase in dyspnea on exertion which she attributed to weight gain but was otherwise doing well from a cardiac standpoint.  Patient presents today for follow-up. ***  Palpitations Monitor in 2018 showed rare PVCs but no concerning arrhythmias. - Stable. No significant palpitations. *** - Continue Lopressor 25mg  as need for palpitations.  Hyperlipidemia Last lipid panel in 07/2019: Total Cholesterol 185, Triglycerides 192, HDL 43, LDL 104. *** - Continue Crestor 20mg  daily. - Will recheck lipid panel and LFTs. ***  Past Medical History:  Diagnosis Date   Allergic rhinitis    Asthma    in the past/ no meds   Hyperlipidemia    Osteoporosis     Past Surgical History:  Procedure Laterality Date   BREAST REDUCTION SURGERY     Bil/   CHOLECYSTECTOMY     FOOT  SURGERY  2018   left foot surgery to shorten toe and remove damaged nerves/ still have pain/Dr Hewitt   valve in urteter     to control urine reflux/ age 89   WISDOM TOOTH EXTRACTION      Current Medications: No outpatient medications have been marked as taking for the 11/26/21 encounter (Appointment) with Darreld Mclean, PA-C.     Allergies:   Aspirin, Dairy aid [tilactase], Codeine, and Voltaren [diclofenac]   Social History   Socioeconomic History   Marital status: Married    Spouse name: Not on file   Number of children: Not on file   Years of education: Not on file   Highest education level: Not on file  Occupational History   Not on file  Tobacco Use   Smoking status: Never   Smokeless tobacco: Never  Substance and Sexual Activity   Alcohol use: Yes    Comment: social   Drug use: No   Sexual activity: Not on file  Other Topics Concern   Not on file  Social History Narrative   Not on file   Social Determinants of Health   Financial Resource Strain: Not on file  Food Insecurity: Not on file  Transportation Needs: Not on file  Physical Activity: Not on file  Stress: Not on file  Social Connections: Not on file     Family History: The patient's family history includes Alcohol abuse in her father; Diabetes in  her mother; Drug abuse in her brother; Heart disease in her father; Heart disease (age of onset: 67) in her brother; Hyperlipidemia in an other family member; Kidney disease in her mother; Stroke in her mother.  ROS:   Please see the history of present illness.     EKGs/Labs/Other Studies Reviewed:    The following studies were reviewed today:  Echocardiogram 04/04/2017: Study Conclusions: - Left ventricle: The cavity size was normal. Wall thickness was    normal. Systolic function was normal. The estimated ejection    fraction was in the range of 60% to 65%. Wall motion was normal;    there were no regional wall motion abnormalities. Features are     consistent with a pseudonormal left ventricular filling pattern,    with concomitant abnormal relaxation and increased filling    pressure (grade 2 diastolic dysfunction).  _______________  Monitor 04/23/2017 to 05/11/2017: Throughout her monitoring, the patient maintained sinus rhythm.  There were no episodes of atrial fibrillation, flutter or SVT.  There were rare PVCs and on 04/28/2017 at 9:37 PM there was transient trigeminal rhythm.  Sinus rhythm was normal for 97% of the night.  Her maximum heart rate was sinus tachycardia at 142 bpm which occurred on July 9 at 2:08 PM.  Her slowest heart rate was sinus bradycardia at 86 bpm on July 7 at 4:23 AM.  EKG:  EKG ordered today. EKG personally reviewed and demonstrates ***.  Recent Labs: No results found for requested labs within last 8760 hours.  Recent Lipid Panel    Component Value Date/Time   CHOL 185 08/16/2019 1047   CHOL 169 01/12/2018 0825   TRIG 192.0 (H) 08/16/2019 1047   HDL 43.20 08/16/2019 1047   HDL 56 01/12/2018 0825   CHOLHDL 4 08/16/2019 1047   VLDL 38.4 08/16/2019 1047   LDLCALC 104 (H) 08/16/2019 1047   LDLCALC 79 01/12/2018 0825   LDLDIRECT 183.0 07/24/2015 0808    Physical Exam:    Vital Signs: There were no vitals taken for this visit.    Wt Readings from Last 3 Encounters:  09/18/20 143 lb (64.9 kg)  08/16/19 137 lb 9.6 oz (62.4 kg)  06/28/19 145 lb (65.8 kg)     General: 66 y.o. female in no acute distress. HEENT: Normocephalic and atraumatic. Sclera clear. EOMs intact. Neck: Supple. No carotid bruits. No JVD. Heart: *** RRR. Distinct S1 and S2. No murmurs, gallops, or rubs. Radial and distal pedal pulses 2+ and equal bilaterally. Lungs: No increased work of breathing. Clear to ausculation bilaterally. No wheezes, rhonchi, or rales.  Abdomen: Soft, non-distended, and non-tender to palpation. Bowel sounds present in all 4 quadrants.  MSK: Normal strength and tone for age. *** Extremities: No lower  extremity edema.    Skin: Warm and dry. Neuro: Alert and oriented x3. No focal deficits. Psych: Normal affect. Responds appropriately.   Assessment:    No diagnosis found.  Plan:     Disposition: Follow up in ***   Medication Adjustments/Labs and Tests Ordered: Current medicines are reviewed at length with the patient today.  Concerns regarding medicines are outlined above.  No orders of the defined types were placed in this encounter.  No orders of the defined types were placed in this encounter.   There are no Patient Instructions on file for this visit.   Signed, Darreld Mclean, PA-C  11/14/2021 12:21 PM    [facility]

## 2021-11-16 DIAGNOSIS — E538 Deficiency of other specified B group vitamins: Secondary | ICD-10-CM | POA: Diagnosis not present

## 2021-11-26 ENCOUNTER — Other Ambulatory Visit: Payer: Self-pay

## 2021-11-26 ENCOUNTER — Encounter: Payer: Self-pay | Admitting: Student

## 2021-11-26 ENCOUNTER — Ambulatory Visit (INDEPENDENT_AMBULATORY_CARE_PROVIDER_SITE_OTHER): Payer: BC Managed Care – PPO | Admitting: Nurse Practitioner

## 2021-11-26 VITALS — BP 122/68 | HR 81 | Ht 63.5 in | Wt 143.0 lb

## 2021-11-26 DIAGNOSIS — R002 Palpitations: Secondary | ICD-10-CM | POA: Diagnosis not present

## 2021-11-26 DIAGNOSIS — I779 Disorder of arteries and arterioles, unspecified: Secondary | ICD-10-CM

## 2021-11-26 DIAGNOSIS — E785 Hyperlipidemia, unspecified: Secondary | ICD-10-CM | POA: Diagnosis not present

## 2021-11-26 MED ORDER — ROSUVASTATIN CALCIUM 20 MG PO TABS: ORAL_TABLET | ORAL | 3 refills | Status: DC

## 2021-11-26 NOTE — Progress Notes (Signed)
Office Visit    Patient Name: Hannah [name] Date of Encounter: 11/26/2021  Primary Care Provider:  Michael Boston, MD Primary Cardiologist:  Shelva Majestic, MD  Chief Complaint    66 year old female with a history of palpitations, mild carotid artery disease, hyperlipidemia, strong family history of CAD, and osteoporosis who presents for follow-up related to palpitations.  Past Medical History    Past Medical History:  Diagnosis Date   Allergic rhinitis    Asthma    in the past/ no meds   Hyperlipidemia    Osteoporosis    Past Surgical History:  Procedure Laterality Date   BREAST REDUCTION SURGERY     Bil/   CHOLECYSTECTOMY     FOOT SURGERY  2018   left foot surgery to shorten toe and remove damaged nerves/ still have pain/Dr Hewitt   valve in urteter     to control urine reflux/ age 65   WISDOM TOOTH EXTRACTION      Allergies  Allergies  Allergen Reactions   Aspirin     REACTION: bruises very easily   Dairy Aid [Tilactase]    Codeine     itching   Voltaren [Diclofenac]     Voltaren gel- makes her feel crazy    History of Present Illness    66 year old female with above past medical history including  palpitations, mild carotid artery disease, hyperlipidemia, strong family history of CAD, and osteoporosis.  She has a history of palpitations, managed with beta-blocker therapy as needed, previously followed by Dr. Verl Blalock.  Monitor in 2018 showed normal sinus rhythm, rare PVCs, no significant arrhythmia.  Echocardiogram in June 2018 showed normal LV function, grade 2 DD. Additionally, she has a history of easy bruisability, though she has never been evaluated for coagulation disorder. She was last seen virtually in November 2021 was doing well from a cardiac standpoint.  She presents today for routine follow-up.  Since her last visit she has done well from a cardiac standpoint.  She denies any palpitations and has not had to use her metoprolol. She remains  active and exercises, walking 3 miles most days of the week and plays golf regularly. She does have stable mild dyspnea with significant exertion, she thinks this is in the setting of mild weight gain. She denies any symptoms concerning for angina. Overall, she is doing well and denies any concerns or complaints today.  Home Medications    Current Outpatient Medications  Medication Sig Dispense Refill   Cholecalciferol (VITAMIN D3 PO) Take 2,000 Units by mouth daily.      Cyanocobalamin (B-12) 1000 MCG TABS      EPINEPHrine 0.3 mg/0.3 mL IJ SOAJ injection Inject 0.3 mLs (0.3 mg total) into the muscle as needed for anaphylaxis. 2 each 1   metoprolol tartrate (LOPRESSOR) 25 MG tablet Take as needed for palpitations 30 tablet 6   rosuvastatin (CRESTOR) 20 MG tablet 1 tablet once daily 90 tablet 3   No current facility-administered medications for this visit.     Review of Systems    She denies chest pain, palpitations, dyspnea, pnd, orthopnea, n, v, dizziness, syncope, edema, weight gain, or early satiety. All other systems reviewed and are otherwise negative except as noted above.   Physical Exam    VS:  BP 122/68    Pulse 81    Ht 5' 3.5" (1.613 m)    Wt 143 lb (64.9 kg)    SpO2 95%    BMI 24.93 kg/m  GEN: Well nourished, well developed, in no acute distress. HEENT: normal. Neck: Supple, no JVD, carotid bruits, or masses. Cardiac: RRR, no murmurs, rubs, or gallops. No clubbing, cyanosis, edema.  Radials/DP/PT 2+ and equal bilaterally.  Respiratory:  Respirations regular and unlabored, clear to auscultation bilaterally. GI: Soft, nontender, nondistended, BS + x 4. MS: no deformity or atrophy. Skin: warm and dry, no rash. Neuro:  Strength and sensation are intact. Psych: Normal affect.  Accessory Clinical Findings    ECG personally reviewed by me today - NSR, 81 bpm, non-specific t-wave abnormality - no acute changes.  Lab Results  Component Value Date   WBC 4.1 01/12/2018    WBC 4.1 01/12/2018   HGB 14.7 01/12/2018   HGB 14.5 01/12/2018   HCT 42.8 01/12/2018   HCT 44.7 01/12/2018   MCV 89 01/12/2018   MCV 95 01/12/2018   PLT 185 01/12/2018   Lab Results  Component Value Date   CREATININE 0.68 08/16/2019   BUN 17 08/16/2019   NA 138 08/16/2019   K 4.5 08/16/2019   CL 100 08/16/2019   CO2 27 08/16/2019   Lab Results  Component Value Date   ALT 20 08/16/2019   AST 26 08/16/2019   ALKPHOS 72 08/16/2019   BILITOT 0.7 08/16/2019   Lab Results  Component Value Date   CHOL 185 08/16/2019   HDL 43.20 08/16/2019   LDLCALC 104 (H) 08/16/2019   LDLDIRECT 183.0 07/24/2015   TRIG 192.0 (H) 08/16/2019   CHOLHDL 4 08/16/2019    Lab Results  Component Value Date   HGBA1C 6.2 08/16/2019    Assessment & Plan    1. Palpitations: Monitor in 2018 showed normal sinus rhythm, rare PVCs, no significant arrhythmia. She denies any recent palpitations. Continue prn metoprolol.   2. Mild carotid artery disease: Noted on prior CT. She denies dizziness, presyncope, syncope. No indication for further testing at this time.  3. Hyperlipidemia: LDL was 71 in 08/2021. Monitored and managed by PCP.  Continue rosuvastatin.  4. Disposition: F/u in 1 year.   Lenna Sciara, NP 11/26/2021, 8:20 AM

## 2021-11-26 NOTE — Patient Instructions (Signed)
Medication Instructions:  Your physician recommends that you continue on your current medications as directed. Please refer to the Current Medication list given to you today.  *If you need a refill on your cardiac medications before your next appointment, please call your pharmacy*   Lab Work: NONE ordered at this time of appointment   If you have labs (blood work) drawn today and your tests are completely normal, you will receive your results only by: [place] (if you have MyChart) OR A paper copy in the mail If you have any lab test that is abnormal or we need to change your treatment, we will call you to review the results.   Testing/Procedures: NONE ordered at this time of appointment     Follow-Up: At [facility], you and your health needs are our priority.  As part of our continuing mission to provide you with exceptional heart care, we have created designated Provider Care Teams.  These Care Teams include your primary Cardiologist (physician) and Advanced Practice Providers (APPs -  Physician Assistants and Nurse Practitioners) who all work together to provide you with the care you need, when you need it.  We recommend signing up for the patient portal called "MyChart".  Sign up information is provided on this After Visit Summary.  MyChart is used to connect with patients for Virtual Visits (Telemedicine).  Patients are able to view lab/test results, encounter notes, upcoming appointments, etc.  Non-urgent messages can be sent to your provider as well.   To learn more about what you can do with MyChart, go to [email or web address].    Your next appointment:   1 year(s)  The format for your next appointment:   In Person  Provider:   Shelva Majestic, MD     Other Instructions

## 2022-04-09 ENCOUNTER — Telehealth: Payer: Self-pay | Admitting: Cardiovascular Disease

## 2022-04-09 NOTE — Telephone Encounter (Signed)
Pt c/o medication issue:  1. Name of Medication: rosuvastatin (CRESTOR) 20 MG tablet  2. How are you currently taking this medication (dosage and times per day)? As prescribed   3. Are you having a reaction (difficulty breathing--STAT)? No   4. What is your medication issue? Patient is wanting to discuss dosage due to research showing hire dosages of this medication causing kidney failure.

## 2022-04-09 NOTE — Telephone Encounter (Signed)
LMTCB

## 2022-04-09 NOTE — Telephone Encounter (Signed)
Patient voiced concern over taking rosuvastatin and an article she read about how the medication can cause renal damage. Her mother went through HD and she does not want to go that route. She will have her PCP fax lab results to Dr. Claiborne Billings. Please advise.

## 2022-04-09 NOTE — Telephone Encounter (Signed)
Patient returning call.

## 2022-04-12 NOTE — Telephone Encounter (Signed)
Rosuvastatin should not cause renal damage

## 2022-04-16 NOTE — Telephone Encounter (Signed)
Received lab work from [facility] [employer].  Will scan into chart today 04/16/22.  Highlights:   Creatine- 0.7 Potassium- 4.3 BUN- 14 AST- 25 ALT- 26 Sodium- 134 Cholesterol- 153 Trigs- 128 HDL- 56 LDL- 71 TSH- 0.65 Vitamin D- 60.0 A1C- 5.5

## 2022-04-16 NOTE — Telephone Encounter (Signed)
Called patient, LVM advising of message below.  Advised we would review lab work once placed into chart.  Advised patient to call back if questions/concerns.

## 2022-04-21 NOTE — Telephone Encounter (Signed)
Labs look good.

## 2022-04-22 ENCOUNTER — Encounter: Payer: Self-pay | Admitting: *Deleted

## 2022-04-29 NOTE — Telephone Encounter (Signed)
Noted. Thanks.

## 2022-07-25 DIAGNOSIS — J40 Bronchitis, not specified as acute or chronic: Secondary | ICD-10-CM | POA: Diagnosis not present

## 2022-09-02 DIAGNOSIS — L738 Other specified follicular disorders: Secondary | ICD-10-CM | POA: Diagnosis not present

## 2022-09-02 DIAGNOSIS — L821 Other seborrheic keratosis: Secondary | ICD-10-CM | POA: Diagnosis not present

## 2022-09-02 DIAGNOSIS — D2262 Melanocytic nevi of left upper limb, including shoulder: Secondary | ICD-10-CM | POA: Diagnosis not present

## 2022-09-02 DIAGNOSIS — L819 Disorder of pigmentation, unspecified: Secondary | ICD-10-CM | POA: Diagnosis not present

## 2022-09-03 DIAGNOSIS — E559 Vitamin D deficiency, unspecified: Secondary | ICD-10-CM | POA: Diagnosis not present

## 2022-09-03 DIAGNOSIS — E538 Deficiency of other specified B group vitamins: Secondary | ICD-10-CM | POA: Diagnosis not present

## 2022-09-03 DIAGNOSIS — E785 Hyperlipidemia, unspecified: Secondary | ICD-10-CM | POA: Diagnosis not present

## 2022-09-09 DIAGNOSIS — H1045 Other chronic allergic conjunctivitis: Secondary | ICD-10-CM | POA: Diagnosis not present

## 2022-09-09 DIAGNOSIS — J3089 Other allergic rhinitis: Secondary | ICD-10-CM | POA: Diagnosis not present

## 2022-09-09 DIAGNOSIS — Z91030 Bee allergy status: Secondary | ICD-10-CM | POA: Diagnosis not present

## 2022-09-09 DIAGNOSIS — J301 Allergic rhinitis due to pollen: Secondary | ICD-10-CM | POA: Diagnosis not present

## 2022-09-10 DIAGNOSIS — Z0000 Encounter for general adult medical examination without abnormal findings: Secondary | ICD-10-CM | POA: Diagnosis not present

## 2022-09-10 DIAGNOSIS — I6523 Occlusion and stenosis of bilateral carotid arteries: Secondary | ICD-10-CM | POA: Diagnosis not present

## 2022-09-10 DIAGNOSIS — Z1339 Encounter for screening examination for other mental health and behavioral disorders: Secondary | ICD-10-CM | POA: Diagnosis not present

## 2022-09-10 DIAGNOSIS — Z1331 Encounter for screening for depression: Secondary | ICD-10-CM | POA: Diagnosis not present

## 2022-09-16 DIAGNOSIS — J209 Acute bronchitis, unspecified: Secondary | ICD-10-CM | POA: Diagnosis not present

## 2022-09-16 DIAGNOSIS — J029 Acute pharyngitis, unspecified: Secondary | ICD-10-CM | POA: Diagnosis not present

## 2022-09-16 DIAGNOSIS — R0981 Nasal congestion: Secondary | ICD-10-CM | POA: Diagnosis not present

## 2022-09-16 DIAGNOSIS — R051 Acute cough: Secondary | ICD-10-CM | POA: Diagnosis not present

## 2022-09-18 ENCOUNTER — Ambulatory Visit (INDEPENDENT_AMBULATORY_CARE_PROVIDER_SITE_OTHER): Payer: BC Managed Care – PPO | Admitting: Obstetrics & Gynecology

## 2022-09-18 ENCOUNTER — Encounter: Payer: Self-pay | Admitting: Obstetrics & Gynecology

## 2022-09-18 VITALS — BP 110/72 | HR 76 | Ht 63.25 in | Wt 138.0 lb

## 2022-09-18 DIAGNOSIS — Z780 Asymptomatic menopausal state: Secondary | ICD-10-CM

## 2022-09-18 DIAGNOSIS — M810 Age-related osteoporosis without current pathological fracture: Secondary | ICD-10-CM | POA: Diagnosis not present

## 2022-09-18 DIAGNOSIS — Z01419 Encounter for gynecological examination (general) (routine) without abnormal findings: Secondary | ICD-10-CM | POA: Diagnosis not present

## 2022-09-18 DIAGNOSIS — N952 Postmenopausal atrophic vaginitis: Secondary | ICD-10-CM

## 2022-09-18 MED ORDER — ESTRADIOL 10 MCG VA TABS: 1.0000 | ORAL_TABLET | VAGINAL | 4 refills | Status: DC
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
## 2022-10-08 ENCOUNTER — Encounter: Payer: Self-pay | Admitting: Obstetrics & Gynecology

## 2022-10-08 DIAGNOSIS — Z1231 Encounter for screening mammogram for malignant neoplasm of breast: Secondary | ICD-10-CM | POA: Diagnosis not present

## 2022-10-09 ENCOUNTER — Other Ambulatory Visit: Payer: Self-pay | Admitting: Obstetrics & Gynecology

## 2022-10-09 ENCOUNTER — Ambulatory Visit (INDEPENDENT_AMBULATORY_CARE_PROVIDER_SITE_OTHER): Payer: BC Managed Care – PPO

## 2022-10-09 DIAGNOSIS — Z780 Asymptomatic menopausal state: Secondary | ICD-10-CM

## 2022-10-09 DIAGNOSIS — M85852 Other specified disorders of bone density and structure, left thigh: Secondary | ICD-10-CM

## 2022-10-09 DIAGNOSIS — Z13820 Encounter for screening for osteoporosis: Secondary | ICD-10-CM

## 2022-10-09 DIAGNOSIS — Z01419 Encounter for gynecological examination (general) (routine) without abnormal findings: Secondary | ICD-10-CM

## 2022-10-09 DIAGNOSIS — M810 Age-related osteoporosis without current pathological fracture: Secondary | ICD-10-CM

## 2022-10-09 DIAGNOSIS — N952 Postmenopausal atrophic vaginitis: Secondary | ICD-10-CM

## 2022-10-10 ENCOUNTER — Encounter: Payer: Self-pay | Admitting: Obstetrics & Gynecology

## 2022-10-18 ENCOUNTER — Encounter: Payer: Self-pay | Admitting: Obstetrics & Gynecology

## 2022-11-18 ENCOUNTER — Telehealth: Payer: Self-pay | Admitting: Nurse Practitioner

## 2022-11-18 DIAGNOSIS — E785 Hyperlipidemia, unspecified: Secondary | ICD-10-CM

## 2022-11-18 NOTE — Telephone Encounter (Signed)
Attempted to call patient, left message for patient to call back to office.   Lab orders placed.   Per Diona Browner, NP Fine to order fasting lipid panel, LFTs to be drawn prior to visit. Thank you. -EM

## 2022-11-18 NOTE — Telephone Encounter (Signed)
Follow Up:    Patient is returning Jenna's call from today.

## 2022-11-18 NOTE — Telephone Encounter (Signed)
Attempted to call patient, left message for patient to call back to office.   Left message stating that lab orders are in and instructions for lab. Left message stating to call back with concerns.

## 2022-11-18 NOTE — Telephone Encounter (Signed)
Pt is requesting if Hannah [name] can order lipid labs for her, since her pcp prescribed crestor for her cholesterol. She wants to get the lipid done before her appt with Hannah [name] on 11/22/22

## 2022-11-20 ENCOUNTER — Other Ambulatory Visit: Payer: Self-pay

## 2022-11-20 DIAGNOSIS — E785 Hyperlipidemia, unspecified: Secondary | ICD-10-CM

## 2022-11-20 NOTE — Progress Notes (Signed)
Office Visit    Patient Name: Hannah [name] Date of Encounter: 11/22/2022  Primary Care Provider:  Michael Boston, MD Primary Cardiologist:  Shelva Majestic, MD  Chief Complaint    67 year old female with a history of palpitations, mild carotid artery disease, hyperlipidemia, strong family history of CAD, and osteoporosis who presents for follow-up related to palpitations.  Past Medical History    Past Medical History:  Diagnosis Date   Allergic rhinitis    Asthma    in the past/ no meds   Blood transfusion without reported diagnosis    as a child   Fibroid    Hyperlipidemia    Past Surgical History:  Procedure Laterality Date   BREAST REDUCTION SURGERY     Bil/   CHOLECYSTECTOMY     FOOT SURGERY  2018   left foot surgery to shorten toe and remove damaged nerves/ still have pain/Dr Hewitt   valve in urteter     to control urine reflux/ age 82   WISDOM TOOTH EXTRACTION      Allergies  Allergies  Allergen Reactions   Aspirin     REACTION: bruises very easily   Dairy Aid [Tilactase]    Codeine     itching   Voltaren [Diclofenac]     Voltaren gel- makes her feel weird     Labs/Other Studies Reviewed    The following studies were reviewed today: Echo 04/04/2017: Study Conclusions   - Left ventricle: The cavity size was normal. Wall thickness was    normal. Systolic function was normal. The estimated ejection    fraction was in the range of 60% to 65%. Wall motion was normal;    there were no regional wall motion abnormalities. Features are    consistent with a pseudonormal left ventricular filling pattern,    with concomitant abnormal relaxation and increased filling    pressure (grade 2 diastolic dysfunction).   Cardiac Monitor 04/23/2017: Throughout her monitoring, the patient maintained sinus rhythm. There were no episodes of atrial fibrillation, flutter or SVT. There were rare PVCs and on 04/28/2017 at 9:37 PM there was transient trigeminal rhythm.  Sinus rhythm was normal for 97% of the night. Her maximum heart rate was sinus tachycardia at 142 bpm which occurred on July 9 at 2:08 PM. Her slowest heart rate was sinus bradycardia at 86 bpm on July 7 at 4:23 AM.   Recent Labs: 11/20/2022: ALT 28  Recent Lipid Panel    Component Value Date/Time   CHOL 170 11/20/2022 0840   TRIG 139 11/20/2022 0840   HDL 59 11/20/2022 0840   CHOLHDL 2.9 11/20/2022 0840   CHOLHDL 4 08/16/2019 1047   VLDL 38.4 08/16/2019 1047   LDLCALC 87 11/20/2022 0840   LDLDIRECT 183.0 07/24/2015 1975    History of Present Illness   67 year old female with above past medical history including  palpitations, mild carotid artery disease, hyperlipidemia, strong family history of CAD, and osteoporosis.   She has a history of palpitations, managed with beta-blocker therapy as needed, previously followed by Dr. Verl Blalock.  Monitor in 2018 showed normal sinus rhythm, rare PVCs, no significant arrhythmia.  Echocardiogram in June 2018 showed normal LV function, grade 2 DD. Additionally, she has a history of easy bruisability, though she has never been evaluated for coagulation disorder. She was last seen in the office on 11/26/2021 and was doing well from a cardiac standpoint.  She noted stable mild dyspnea with significant exertion, thought to be in the setting  of mild weight gain.   She presents today for routine follow-up.  Since her last visit she has done well from a cardiac standpoint.  She denies any palpitations, dizziness, presyncope, syncope, denies symptoms concerning for angina.  She notes that she read some information about potential kidney damage in relation to Crestor and through shared decision making with her PCP decreased her dose to 10 mg daily.  She plans to travel to [place] with her husband in September 2024.  She is active and plays golf and walks regularly.  Overall, she reports feeling well.  Home Medications    Current Outpatient Medications  Medication  Sig Dispense Refill   albuterol (VENTOLIN HFA) 108 (90 Base) MCG/ACT inhaler SMARTSIG:1 Puff(s) By Mouth Every 4 Hours PRN     Cholecalciferol (VITAMIN D3 PO) Take 2,000 Units by mouth daily.      Cyanocobalamin (B-12 PO) Take by mouth.     EPINEPHrine 0.3 mg/0.3 mL IJ SOAJ injection Inject 0.3 mLs (0.3 mg total) into the muscle as needed for anaphylaxis. 2 each 1   Estradiol 10 MCG TABS vaginal tablet Place 1 tablet (10 mcg total) vaginally 2 (two) times a week. 24 tablet 4   fluticasone (FLONASE) 50 MCG/ACT nasal spray Place into both nostrils daily.     azithromycin (ZITHROMAX) 250 MG tablet Take 250 mg by mouth as directed.     predniSONE (DELTASONE) 20 MG tablet Take 20 mg by mouth every morning.     promethazine-dextromethorphan (PROMETHAZINE-DM) 6.25-15 MG/5ML syrup Take by mouth. (Patient not taking: Reported on 11/22/2022)     rosuvastatin (CRESTOR) 10 MG tablet Take 1 tablet (10 mg total) by mouth daily. 90 tablet 3   No current facility-administered medications for this visit.     Review of Systems    She denies chest pain, palpitations, dyspnea, pnd, orthopnea, n, v, dizziness, syncope, edema, weight gain, or early satiety. All other systems reviewed and are otherwise negative except as noted above.  Physical Exam    VS:  BP 108/64   Pulse 71   Ht '5\' 2"'$  (1.575 m)   Wt 139 lb 12.8 oz (63.4 kg)   SpO2 95%   BMI 25.57 kg/m   GEN: Well nourished, well developed, in no acute distress. HEENT: normal. Neck: Supple, no JVD, carotid bruits, or masses. Cardiac: RRR, no murmurs, rubs, or gallops. No clubbing, cyanosis, edema.  Radials/DP/PT 2+ and equal bilaterally.  Respiratory:  Respirations regular and unlabored, clear to auscultation bilaterally. GI: Soft, nontender, nondistended, BS + x 4. MS: no deformity or atrophy. Skin: warm and dry, no rash. Neuro:  Strength and sensation are intact. Psych: Normal affect.  Accessory Clinical Findings    ECG personally reviewed by me  today -NSR, 71 bpm- no acute changes.   Lab Results  Component Value Date   WBC 4.1 01/12/2018   WBC 4.1 01/12/2018   HGB 14.7 01/12/2018   HGB 14.5 01/12/2018   HCT 42.8 01/12/2018   HCT 44.7 01/12/2018   MCV 89 01/12/2018   MCV 95 01/12/2018   PLT 185 01/12/2018   Lab Results  Component Value Date   CREATININE 0.68 08/16/2019   BUN 17 08/16/2019   NA 138 08/16/2019   K 4.5 08/16/2019   CL 100 08/16/2019   CO2 27 08/16/2019   Lab Results  Component Value Date   ALT 28 11/20/2022   AST 29 11/20/2022   ALKPHOS 79 11/20/2022   BILITOT 0.4 11/20/2022   Lab Results  Component Value Date   CHOL 170 11/20/2022   HDL 59 11/20/2022   LDLCALC 87 11/20/2022   LDLDIRECT 183.0 07/24/2015   TRIG 139 11/20/2022   CHOLHDL 2.9 11/20/2022    Lab Results  Component Value Date   HGBA1C 6.2 08/16/2019    Assessment & Plan   1. Palpitations: Monitor in 2018 showed normal sinus rhythm, rare PVCs, no significant arrhythmia. She denies any recent palpitations. Continue prn metoprolol.   2. Mild carotid artery disease: Noted on prior CT. She denies dizziness, presyncope, syncope. No indication for further testing at this time.  3. Hyperlipidemia: LDL was 87 in 10/2022.  She recently decreased her Crestor to 10 mg daily as she was concerned about potential kidney damage.  Discussed ongoing lifestyle modifications with diet and exercise.  If LDL remains elevated above 70, consider increasing Crestor again in the future.  For now, ok to continue rosuvastatin 10 mg daily.  4. Disposition: F/u in 1 year, sooner if needed.      Lenna Sciara, NP 11/22/2022, 9:45 AM

## 2022-11-21 LAB — LIPID PANEL
Chol/HDL Ratio: 2.9 ratio (ref 0.0–4.4)
Cholesterol, Total: 170 mg/dL (ref 100–199)
HDL: 59 mg/dL (ref >39)
LDL Chol Calc (NIH): 87 mg/dL (ref 0–99)
Triglycerides: 139 mg/dL (ref 0–149)
VLDL Cholesterol Cal: 24 mg/dL (ref 5–40)

## 2022-11-21 LAB — HEPATIC FUNCTION PANEL
ALT: 28 IU/L (ref 0–32)
AST: 29 IU/L (ref 0–40)
Albumin: 4.2 g/dL (ref 3.9–4.9)
Alkaline Phosphatase: 79 IU/L (ref 44–121)
Bilirubin Total: 0.4 mg/dL (ref 0.0–1.2)
Bilirubin, Direct: 0.13 mg/dL (ref 0.00–0.40)
Total Protein: 6.1 g/dL (ref 6.0–8.5)

## 2022-11-22 ENCOUNTER — Encounter: Payer: Self-pay | Admitting: Nurse Practitioner

## 2022-11-22 ENCOUNTER — Ambulatory Visit: Payer: BC Managed Care – PPO | Attending: Cardiovascular Disease | Admitting: Nurse Practitioner

## 2022-11-22 VITALS — BP 108/64 | HR 71 | Ht 62.0 in | Wt 139.8 lb

## 2022-11-22 DIAGNOSIS — E785 Hyperlipidemia, unspecified: Secondary | ICD-10-CM | POA: Diagnosis not present

## 2022-11-22 DIAGNOSIS — R002 Palpitations: Secondary | ICD-10-CM

## 2022-11-22 DIAGNOSIS — I779 Disorder of arteries and arterioles, unspecified: Secondary | ICD-10-CM | POA: Diagnosis not present

## 2022-11-22 MED ORDER — ROSUVASTATIN CALCIUM 10 MG PO TABS: 10.0000 mg | ORAL_TABLET | Freq: Every day | ORAL | 3 refills | Status: DC

## 2022-11-22 NOTE — Patient Instructions (Addendum)
Medication Instructions:  Continue Rosuvastatin 10 mg daily  *If you need a refill on your cardiac medications before your next appointment, please call your pharmacy*   Lab Work: NONE ordered at this time of appointment   If you have labs (blood work) drawn today and your tests are completely normal, you will receive your results only by: [place] (if you have MyChart) OR A paper copy in the mail If you have any lab test that is abnormal or we need to change your treatment, we will call you to review the results.   Testing/Procedures: NONE ordered at this time of appointment     Follow-Up: At [facility], you and your health needs are our priority.  As part of our continuing mission to provide you with exceptional heart care, we have created designated Provider Care Teams.  These Care Teams include your primary Cardiologist (physician) and Advanced Practice Providers (APPs -  Physician Assistants and Nurse Practitioners) who all work together to provide you with the care you need, when you need it.  We recommend signing up for the patient portal called "MyChart".  Sign up information is provided on this After Visit Summary.  MyChart is used to connect with patients for Virtual Visits (Telemedicine).  Patients are able to view lab/test results, encounter notes, upcoming appointments, etc.  Non-urgent messages can be sent to your provider as well.   To learn more about what you can do with MyChart, go to [email or web address].    Your next appointment:   1 year(s)  Provider:   Shelva Majestic, MD  or Diona Browner, NP        Other Instructions

## 2023-02-03 ENCOUNTER — Ambulatory Visit: Payer: BC Managed Care – PPO | Admitting: Cardiovascular Disease

## 2023-05-13 ENCOUNTER — Ambulatory Visit (HOSPITAL_COMMUNITY)
Admission: RE | Admit: 2023-05-13 | Discharge: 2023-05-13 | Disposition: A | Discharge status: Home / Self Care | Payer: BC Managed Care – PPO | Source: Ambulatory Visit | Attending: Internal Medicine | Admitting: Internal Medicine

## 2023-05-13 ENCOUNTER — Encounter (HOSPITAL_COMMUNITY): Payer: Self-pay

## 2023-05-13 ENCOUNTER — Ambulatory Visit (INDEPENDENT_AMBULATORY_CARE_PROVIDER_SITE_OTHER): Payer: BC Managed Care – PPO

## 2023-05-13 VITALS — BP 128/78 | HR 77 | Temp 99.0°F | Resp 16 | Ht 63.0 in | Wt 130.0 lb

## 2023-05-13 DIAGNOSIS — S2232XA Fracture of one rib, left side, initial encounter for closed fracture: Secondary | ICD-10-CM

## 2023-05-13 DIAGNOSIS — R0781 Pleurodynia: Secondary | ICD-10-CM

## 2023-05-13 NOTE — Discharge Instructions (Addendum)
The rib xray has not been officially read by the Radiologist yet.  I do not seen any obvious fractures on my review of the image.  Continue Advil for pain as well as deep breathing exercises.  You can also use Lidoderm patches to help with the pain in the area.   Seek care if you develop cough, fever, chills, fatigue, or other signs of pneumonia.
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
## 2023-05-13 NOTE — ED Triage Notes (Signed)
Patient here today with c/o left rib injury X 8 days. Patient was carrying a cooler and tripped and fell onto the cooler and her left rib area landed on the corner of the cooler. Since then she has been having pain and discomfort. Difficulty sleeping. Tender to touch. She has been taking Advil with some relief.

## 2023-06-10 DIAGNOSIS — Z7184 Encounter for health counseling related to travel: Secondary | ICD-10-CM | POA: Diagnosis not present

## 2023-08-26 DIAGNOSIS — R1031 Right lower quadrant pain: Secondary | ICD-10-CM | POA: Diagnosis not present

## 2023-08-26 DIAGNOSIS — K5732 Diverticulitis of large intestine without perforation or abscess without bleeding: Secondary | ICD-10-CM | POA: Diagnosis not present

## 2023-08-27 DIAGNOSIS — R197 Diarrhea, unspecified: Secondary | ICD-10-CM | POA: Diagnosis not present

## 2023-09-02 ENCOUNTER — Encounter: Payer: Self-pay | Admitting: Internal Medicine

## 2023-09-10 DIAGNOSIS — E559 Vitamin D deficiency, unspecified: Secondary | ICD-10-CM | POA: Diagnosis not present

## 2023-09-10 DIAGNOSIS — E538 Deficiency of other specified B group vitamins: Secondary | ICD-10-CM | POA: Diagnosis not present

## 2023-09-10 DIAGNOSIS — E785 Hyperlipidemia, unspecified: Secondary | ICD-10-CM | POA: Diagnosis not present

## 2023-09-17 DIAGNOSIS — Z0000 Encounter for general adult medical examination without abnormal findings: Secondary | ICD-10-CM | POA: Diagnosis not present

## 2023-09-17 DIAGNOSIS — Z1339 Encounter for screening examination for other mental health and behavioral disorders: Secondary | ICD-10-CM | POA: Diagnosis not present

## 2023-09-17 DIAGNOSIS — M79605 Pain in left leg: Secondary | ICD-10-CM | POA: Diagnosis not present

## 2023-09-17 DIAGNOSIS — Z1331 Encounter for screening for depression: Secondary | ICD-10-CM | POA: Diagnosis not present

## 2023-09-17 DIAGNOSIS — M85859 Other specified disorders of bone density and structure, unspecified thigh: Secondary | ICD-10-CM | POA: Diagnosis not present

## 2023-09-23 DIAGNOSIS — Z124 Encounter for screening for malignant neoplasm of cervix: Secondary | ICD-10-CM | POA: Diagnosis not present

## 2023-09-23 DIAGNOSIS — Z1151 Encounter for screening for human papillomavirus (HPV): Secondary | ICD-10-CM | POA: Diagnosis not present

## 2023-09-23 DIAGNOSIS — Z01419 Encounter for gynecological examination (general) (routine) without abnormal findings: Secondary | ICD-10-CM | POA: Diagnosis not present

## 2023-09-23 DIAGNOSIS — Z6823 Body mass index (BMI) 23.0-23.9, adult: Secondary | ICD-10-CM | POA: Diagnosis not present

## 2023-10-02 ENCOUNTER — Ambulatory Visit: Payer: BC Managed Care – PPO

## 2023-10-02 ENCOUNTER — Telehealth: Payer: Self-pay

## 2023-10-02 VITALS — Ht 63.0 in | Wt 127.0 lb

## 2023-10-02 DIAGNOSIS — R1310 Dysphagia, unspecified: Secondary | ICD-10-CM

## 2023-10-02 DIAGNOSIS — R933 Abnormal findings on diagnostic imaging of other parts of digestive tract: Secondary | ICD-10-CM

## 2023-10-02 DIAGNOSIS — Z860100 Personal history of colon polyps, unspecified: Secondary | ICD-10-CM

## 2023-10-02 MED ORDER — NA SULFATE-K SULFATE-MG SULF 17.5-3.13-1.6 GM/177ML PO SOLN: 1.0000 | Freq: Once | ORAL | 0 refills | Status: AC

## 2023-10-02 NOTE — Telephone Encounter (Signed)
Dr. Marina Goodell,   Hannah [name] wanted me to reach out to you to make you aware she had a CT 10/29 due adb pain and usual BM due to an infection she contracted while in [place].    Results attached see full report in care everywhere  Lower Chest: Small hiatal hernia with mild thickening of the distal esophagus, which can be secondary to reflux or esophagitis. Dependent subsegmental atelectasis/scarring.   She c/o pills and sometimes bread getting stuck but washes down with water consumption. Her PCP Dr. Nadene Rubins told her to mention during her PV apt she is questioning if she could have upper at the same time as her colonoscopy per PCP recommendation.   Please advise. Thank you,   PV

## 2023-10-02 NOTE — Telephone Encounter (Signed)
Yes, okay to add on EGD for "abnormal CT scan of the esophagus and dysphagia". Thanks, Dr. Marina Goodell

## 2023-10-02 NOTE — Progress Notes (Signed)
No egg or soy allergy known to patient  No issues known to pt with past sedation with any surgeries or procedures Patient denies ever being told they had issues or difficulty with intubation  No FH of Malignant Hyperthermia Pt is not on diet pills Pt is not on  home 02  Pt is not on blood thinners  Pt denies issues with constipation  No A fib or A flutter Have any cardiac testing pending--no LOA: independent  Prep:   Patient's chart reviewed by Cathlyn Parsons CNRA prior to previsit and patient appropriate for the [facility].  Previsit completed and red dot placed by patient's name on their procedure day (on provider's schedule).     PV competed with patient. Prep instructions sent via mychart and home address. Goodrx coupon for [facility] provided to use for price reduction if needed.

## 2023-10-02 NOTE — Telephone Encounter (Signed)
Attempt made to r/s patient. VM left for patient to call back.

## 2023-10-02 NOTE — Addendum Note (Signed)
Addended by: Darylene Price on: 10/02/2023 04:47 PM   Modules accepted: Orders

## 2023-10-03 NOTE — Telephone Encounter (Signed)
Apt r/s

## 2023-10-13 ENCOUNTER — Encounter: Payer: BC Managed Care – PPO | Admitting: Internal Medicine

## 2023-10-14 DIAGNOSIS — Z1231 Encounter for screening mammogram for malignant neoplasm of breast: Secondary | ICD-10-CM | POA: Diagnosis not present

## 2023-11-06 ENCOUNTER — Other Ambulatory Visit: Payer: Self-pay | Admitting: Nurse Practitioner

## 2023-11-21 ENCOUNTER — Encounter: Payer: Self-pay | Admitting: Internal Medicine

## 2023-11-27 ENCOUNTER — Encounter: Payer: Self-pay | Admitting: Internal Medicine

## 2023-11-27 ENCOUNTER — Ambulatory Visit: Payer: BC Managed Care – PPO | Admitting: Internal Medicine

## 2023-11-27 VITALS — BP 123/63 | HR 75 | Temp 98.2°F | Resp 11 | Ht 63.0 in | Wt 127.0 lb

## 2023-11-27 DIAGNOSIS — R1310 Dysphagia, unspecified: Secondary | ICD-10-CM

## 2023-11-27 DIAGNOSIS — K449 Diaphragmatic hernia without obstruction or gangrene: Secondary | ICD-10-CM

## 2023-11-27 DIAGNOSIS — K2100 Gastro-esophageal reflux disease with esophagitis, without bleeding: Secondary | ICD-10-CM

## 2023-11-27 DIAGNOSIS — Z860101 Personal history of adenomatous and serrated colon polyps: Secondary | ICD-10-CM

## 2023-11-27 DIAGNOSIS — K222 Esophageal obstruction: Secondary | ICD-10-CM

## 2023-11-27 DIAGNOSIS — K5730 Diverticulosis of large intestine without perforation or abscess without bleeding: Secondary | ICD-10-CM

## 2023-11-27 DIAGNOSIS — D123 Benign neoplasm of transverse colon: Secondary | ICD-10-CM

## 2023-11-27 DIAGNOSIS — Z1211 Encounter for screening for malignant neoplasm of colon: Secondary | ICD-10-CM | POA: Diagnosis not present

## 2023-11-27 DIAGNOSIS — R933 Abnormal findings on diagnostic imaging of other parts of digestive tract: Secondary | ICD-10-CM

## 2023-11-27 DIAGNOSIS — Z860100 Personal history of colon polyps, unspecified: Secondary | ICD-10-CM

## 2023-11-27 MED ORDER — SODIUM CHLORIDE 0.9 % IV SOLN: 500.0000 mL | INTRAVENOUS | Status: DC

## 2023-11-27 MED ORDER — PANTOPRAZOLE SODIUM 40 MG PO TBEC: 40.0000 mg | DELAYED_RELEASE_TABLET | Freq: Every day | ORAL | 11 refills | Status: DC

## 2023-11-27 NOTE — Progress Notes (Signed)
Vitals-Hannah [name]  Pt's states no medical or surgical changes since previsit or office visit.

## 2023-11-27 NOTE — Progress Notes (Signed)
HISTORY OF PRESENT ILLNESS:  Hannah [name] is a 68 y.o. female who presents today for surveillance colonoscopy as well as upper endoscopy with possible esophageal dilation to evaluate dysphagia and abnormal CT scan.  REVIEW OF SYSTEMS:  All non-GI ROS negative except for  Past Medical History:  Diagnosis Date   Allergic rhinitis    Asthma    in the past/ no meds   Blood transfusion without reported diagnosis    as a child   Fibroid    Hyperlipidemia     Past Surgical History:  Procedure Laterality Date   BREAST REDUCTION SURGERY     Bil/   CHOLECYSTECTOMY     FOOT SURGERY  2018   left foot surgery to shorten toe and remove damaged nerves/ still have pain/Dr Hewitt   valve in urteter     to control urine reflux/ age 37   WISDOM TOOTH EXTRACTION      Social History Salayah M Tayler  reports that she has never smoked. She has never used smokeless tobacco. She reports current alcohol use. She reports that she does not use drugs.  family history includes Alcohol abuse in her father and mother; Breast cancer in her mother; Diabetes in her mother; Drug abuse in her brother; Heart disease in her father; Heart disease (age of onset: 33) in her brother; Kidney disease in her mother; Stroke in her mother.  Allergies  Allergen Reactions   Other Anaphylaxis    Blueberries and walnut    Peanut-Containing Drug Products Anaphylaxis    Walnuts, blueberries   Testosterone Hives    hives   Tree Extract Anaphylaxis, Diarrhea and Hives    Other Reaction(s): flushing, irregular heart rate, vomiting   Vaccinium Angustifolium Anaphylaxis   Aspirin     REACTION: bruises very easily   Dairy Aid [Tilactase] Other (See Comments)    Patient denies allergy to dairy   Codeine     itching  Other Reaction(s): Unknown   Grass Pollen(K-O-R-T-Swt Vern) Itching    itching   Molds & Smuts Other (See Comments)    Runny nose   Pollen Extract Itching    Other Reaction(s): Not available    Voltaren [Diclofenac] Itching    Voltaren gel- makes her feel weird       PHYSICAL EXAMINATION: Vital signs: BP (!) 142/81 (BP Location: Right Arm, Patient Position: Sitting, Cuff Size: Normal)   Pulse 80   Temp 98.2 F (36.8 C) (Temporal)   Ht 5\' 3"  (1.6 m)   Wt 127 lb (57.6 kg)   SpO2 98%   BMI 22.50 kg/m  General: Well-developed, well-nourished, no acute distress HEENT: Sclerae are anicteric, conjunctiva pink. Oral mucosa intact Lungs: Clear Heart: Regular Abdomen: soft, nontender, nondistended, no obvious ascites, no peritoneal signs, normal bowel sounds. No organomegaly. Extremities: No edema Psychiatric: alert and oriented x3. Cooperative     ASSESSMENT:  1.  History of adenomatous colon polyps 2.  Dysphagia and abnormal CT of the esophagus   PLAN:  1.  Colonoscopy, surveillance  2.  Upper endoscopy with possible esophageal dilation

## 2023-11-27 NOTE — Op Note (Signed)
[facility] Patient Name: Hannah [name] Procedure Date: 11/27/2023 9:43 AM MRN: [id number] Endoscopist: Wilhemina Bonito. Marina Goodell , MD, [id number] Age: 68 Referring MD:  Date of Birth: Jun 16, 1956 Gender: Female Account #: [id number] Procedure:                Colonoscopy with cold snare polypectomy x 1 Indications:              High risk colon cancer surveillance: Personal                            history of multiple (3 or more) adenomas. Previous                            examinations 2009 and 2014 with Dr. Kinnie Scales. Here                            2019 Medicines:                Monitored Anesthesia Care Procedure:                Pre-Anesthesia Assessment:                           - Prior to the procedure, a History and Physical                            was performed, and patient medications and                            allergies were reviewed. The patient's tolerance of                            previous anesthesia was also reviewed. The risks                            and benefits of the procedure and the sedation                            options and risks were discussed with the patient.                            All questions were answered, and informed consent                            was obtained. Prior Anticoagulants: The patient has                            taken no anticoagulant or antiplatelet agents. ASA                            Grade Assessment: II - A patient with mild systemic                            disease. After reviewing the risks and benefits,  the patient was deemed in satisfactory condition to                            undergo the procedure.                           After obtaining informed consent, the colonoscope                            was passed under direct vision. Throughout the                            procedure, the patient's blood pressure, pulse, and                            oxygen saturations were  monitored continuously. The                            CF [id number] #[id number] was introduced through the anus                            and advanced to the the cecum, identified by                            appendiceal orifice and ileocecal valve. The                            ileocecal valve, appendiceal orifice, and rectum                            were photographed. The quality of the bowel                            preparation was excellent. The colonoscopy was                            performed without difficulty. The patient tolerated                            the procedure well. The bowel preparation used was                            SUPREP via split dose instruction. Scope In: 10:02:30 AM Scope Out: 10:12:33 AM Scope Withdrawal Time: 0 hours 7 minutes 34 seconds  Total Procedure Duration: 0 hours 10 minutes 3 seconds  Findings:                 A 3 mm polyp was found in the transverse colon. The                            polyp was removed with a cold snare. Resection and                            retrieval were complete.  Multiple diverticula were found in the sigmoid                            colon.                           The exam was otherwise without abnormality on                            direct and retroflexion views. Complications:            No immediate complications. Estimated blood loss:                            None. Estimated Blood Loss:     Estimated blood loss: none. Impression:               - One 3 mm polyp in the transverse colon, removed                            with a cold snare. Resected and retrieved.                           - Diverticulosis in the sigmoid colon.                           - The examination was otherwise normal on direct                            and retroflexion views. Recommendation:           - Repeat colonoscopy in 5 years for surveillance.                           - Patient has a contact  number available for                            emergencies. The signs and symptoms of potential                            delayed complications were discussed with the                            patient. Return to normal activities tomorrow.                            Written discharge instructions were provided to the                            patient.                           - Resume previous diet.                           - Continue present medications.                           -  Await pathology results. Wilhemina Bonito. Marina Goodell, MD 11/27/2023 10:17:37 AM This report has been signed electronically.

## 2023-11-27 NOTE — Progress Notes (Signed)
Sedate, gd SR, tolerated procedure well, VSS, report to RN

## 2023-11-27 NOTE — Progress Notes (Signed)
FOLLOW UP APPT MADE FOR  02/10/2024 AT 0920am. PT VERBALIZES UNDERSTANDING.  LETTER GIVEN TO PT.

## 2023-11-27 NOTE — Patient Instructions (Signed)
Educational handout provided to patient related to STRICTURES, POST DILATION DIET,, Polyps, and Diverticulosis  POST DILATION DIET  Continue present medications  Awaiting pathology results   YOU HAD AN ENDOSCOPIC PROCEDURE TODAY AT THE [facility]:   Refer to the procedure report that was given to you for any specific questions about what was found during the examination.  If the procedure report does not answer your questions, please call your gastroenterologist to clarify.  If you requested that your care partner not be given the details of your procedure findings, then the procedure report has been included in a sealed envelope for you to review at your convenience later.  YOU SHOULD EXPECT: Some feelings of bloating in the abdomen. Passage of more gas than usual.  Walking can help get rid of the air that was put into your GI tract during the procedure and reduce the bloating. If you had a lower endoscopy (such as a colonoscopy or flexible sigmoidoscopy) you may notice spotting of blood in your stool or on the toilet paper. If you underwent a bowel prep for your procedure, you may not have a normal bowel movement for a few days.  Please Note:  You might notice some irritation and congestion in your nose or some drainage.  This is from the oxygen used during your procedure.  There is no need for concern and it should clear up in a day or so.  SYMPTOMS TO REPORT IMMEDIATELY:  Following lower endoscopy (colonoscopy or flexible sigmoidoscopy):  Excessive amounts of blood in the stool  Significant tenderness or worsening of abdominal pains  Swelling of the abdomen that is new, acute  Fever of 100F or higher  Following upper endoscopy (EGD)  Vomiting of blood or coffee ground material  New chest pain or pain under the shoulder blades  Painful or persistently difficult swallowing  New shortness of breath  Fever of 100F or higher  Black, tarry-looking stools  For urgent or  emergent issues, a gastroenterologist can be reached at any hour by calling [phone]. Do not use MyChart messaging for urgent concerns.    DIET:  We do recommend a small meal at first, but then you may proceed to your regular diet.  Drink plenty of fluids but you should avoid alcoholic beverages for 24 hours.  ACTIVITY:  You should plan to take it easy for the rest of today and you should NOT DRIVE or use heavy machinery until tomorrow (because of the sedation medicines used during the test).    FOLLOW UP: Our staff will call the number listed on your records the next business day following your procedure.  We will call around 7:15- 8:00 am to check on you and address any questions or concerns that you may have regarding the information given to you following your procedure. If we do not reach you, we will leave a message.     If any biopsies were taken you will be contacted by phone or by letter within the next 1-3 weeks.  Please call us at [phone] if you have not heard about the biopsies in 3 weeks.    SIGNATURES/CONFIDENTIALITY: You and/or your care partner have signed paperwork which will be entered into your electronic medical record.  These signatures attest to the fact that that the information above on your After Visit Summary has been reviewed and is understood.  Full responsibility of the confidentiality of this discharge information lies with you and/or your care-partner.

## 2023-11-27 NOTE — Op Note (Signed)
[facility] Patient Name: Hannah [name] Procedure Date: 11/27/2023 9:42 AM MRN: [id number] Endoscopist: Wilhemina Bonito. Marina Goodell , MD, [id number] Age: 68 Referring MD:  Date of Birth: 1955/10/31 Gender: Female Account #: [id number] Procedure:                Upper GI endoscopy with balloon dilation of the                            esophagus. 18-20 mm Indications:              Dysphagia, Abnormal CT of the GI tract (esophageal                            thickening) Medicines:                Monitored Anesthesia Care Procedure:                Pre-Anesthesia Assessment:                           - Prior to the procedure, a History and Physical                            was performed, and patient medications and                            allergies were reviewed. The patient's tolerance of                            previous anesthesia was also reviewed. The risks                            and benefits of the procedure and the sedation                            options and risks were discussed with the patient.                            All questions were answered, and informed consent                            was obtained. Prior Anticoagulants: The patient has                            taken no anticoagulant or antiplatelet agents. ASA                            Grade Assessment: II - A patient with mild systemic                            disease. After reviewing the risks and benefits,                            the patient was deemed in satisfactory condition to  undergo the procedure.                           After obtaining informed consent, the endoscope was                            passed under direct vision. Throughout the                            procedure, the patient's blood pressure, pulse, and                            oxygen saturations were monitored continuously. The                            Olympus Scope SN [id number] was  introduced through the                            mouth, and advanced to the second part of duodenum.                            The upper GI endoscopy was accomplished without                            difficulty. The patient tolerated the procedure                            well. Scope In: Scope Out: Findings:                 The esophagus revealed esophagitis at the                            gastroesophageal junction as well as a fibrotic                            ringlike stricture at 36 cm from the incisors.                            After completing the endoscopic survey, A TTS                            dilator was passed through the scope. Dilation with                            an 18-19-20 mm balloon dilator was performed to 20                            mm. There was minor ring disruption.                           The stomach revealed a moderate hiatal hernia but                            was otherwise  normal.                           The examined duodenum was normal.                           The cardia and gastric fundus were normal on                            retroflexion. Complications:            No immediate complications. Estimated Blood Loss:     Estimated blood loss: none. Impression:               1. GERD complicated by esophagitis and peptic                            stricture status post dilation                           2. Hiatal hernia                           3. Otherwise unremarkable EGD. Recommendation:           1. Patient has a contact number available for                            emergencies. The signs and symptoms of potential                            delayed complications were discussed with the                            patient. Return to normal activities tomorrow.                            Written discharge instructions were provided to the                            patient.                           2. Post dilation diet.                            3. Continue present medications                           4. PRESCRIBE pantoprazole 40 mg daily; #30; 11                            refills                           5. Office follow-up with Dr. Marina Goodell in 6 to 8 weeks. Wilhemina Bonito. Marina Goodell, MD 11/27/2023 10:30:23 AM This report has been signed electronically.

## 2023-11-28 ENCOUNTER — Telehealth: Payer: Self-pay

## 2023-11-28 NOTE — Telephone Encounter (Signed)
  Follow up Call-     11/27/2023    8:49 AM 11/27/2023    8:42 AM  Call back number  Post procedure Call Back phone  # [phone]   Permission to leave phone message  Yes     Patient questions:  Do you have a fever, pain , or abdominal swelling? No. Pain Score  0 *  Have you tolerated food without any problems? Yes.    Have you been able to return to your normal activities? Yes.    Do you have any questions about your discharge instructions: Diet   No. Medications  No. Follow up visit  No.  Do you have questions or concerns about your Care? No.  Actions: * If pain score is 4 or above: No action needed, pain <4.

## 2023-12-01 ENCOUNTER — Encounter: Payer: Self-pay | Admitting: Internal Medicine

## 2023-12-01 LAB — SURGICAL PATHOLOGY

## 2023-12-06 ENCOUNTER — Encounter: Payer: Self-pay | Admitting: Internal Medicine

## 2023-12-09 ENCOUNTER — Other Ambulatory Visit: Payer: Self-pay | Admitting: Nurse Practitioner

## 2023-12-09 DIAGNOSIS — L821 Other seborrheic keratosis: Secondary | ICD-10-CM | POA: Diagnosis not present

## 2023-12-09 DIAGNOSIS — L853 Xerosis cutis: Secondary | ICD-10-CM | POA: Diagnosis not present

## 2023-12-09 DIAGNOSIS — L738 Other specified follicular disorders: Secondary | ICD-10-CM | POA: Diagnosis not present

## 2024-02-10 ENCOUNTER — Encounter: Payer: Self-pay | Admitting: Internal Medicine

## 2024-02-10 ENCOUNTER — Ambulatory Visit (INDEPENDENT_AMBULATORY_CARE_PROVIDER_SITE_OTHER): Payer: BC Managed Care – PPO | Admitting: Internal Medicine

## 2024-02-10 VITALS — BP 122/62 | HR 65 | Ht 63.0 in | Wt 126.0 lb

## 2024-02-10 DIAGNOSIS — K2100 Gastro-esophageal reflux disease with esophagitis, without bleeding: Secondary | ICD-10-CM | POA: Diagnosis not present

## 2024-02-10 DIAGNOSIS — K219 Gastro-esophageal reflux disease without esophagitis: Secondary | ICD-10-CM

## 2024-02-10 DIAGNOSIS — Z860100 Personal history of colon polyps, unspecified: Secondary | ICD-10-CM

## 2024-02-10 DIAGNOSIS — R1310 Dysphagia, unspecified: Secondary | ICD-10-CM

## 2024-02-10 DIAGNOSIS — K222 Esophageal obstruction: Secondary | ICD-10-CM

## 2024-02-10 DIAGNOSIS — R142 Eructation: Secondary | ICD-10-CM

## 2024-02-10 MED ORDER — PANTOPRAZOLE SODIUM 40 MG PO TBEC: 40.0000 mg | DELAYED_RELEASE_TABLET | Freq: Every day | ORAL | 3 refills | Status: DC

## 2024-02-10 NOTE — Patient Instructions (Signed)
 We have sent the following medications to your pharmacy for you to pick up at your convenience:  Pantoprazole.  Please follow up in one year.  _______________________________________________________  If your blood pressure at your visit was 140/90 or greater, please contact your primary care physician to follow up on this.  _______________________________________________________  If you are age 69 or older, your body mass index should be between 23-30. Your Body mass index is 22.32 kg/m. If this is out of the aforementioned range listed, please consider follow up with your Primary Care Provider.  If you are age 6 or younger, your body mass index should be between 19-25. Your Body mass index is 22.32 kg/m. If this is out of the aformentioned range listed, please consider follow up with your Primary Care Provider.   ________________________________________________________  The [facility] providers would like to encourage you to use MYCHART to communicate with providers for non-urgent requests or questions.  Due to long hold times on the telephone, sending your provider a message by [facility] may be a faster and more efficient way to get a response.  Please allow 48 business hours for a response.  Please remember that this is for non-urgent requests.  _______________________________________________________

## 2024-02-10 NOTE — Progress Notes (Signed)
 HISTORY OF PRESENT ILLNESS:  Hannah [name] is a 68 y.o. female with past medical history as listed below who presents today for follow-up regarding GERD complicated by peptic stricture requiring esophageal dilation.  The patient was seen November 27, 2023 regarding surveillance colonoscopy for history of colon polyps.  Time, she also underwent upper endoscopy to evaluate abnormal CT scan which revealed thickening of the esophagus.  Upon further questioning, she reported reflux symptoms and intermittent solid food dysphagia.  Complete colonoscopy revealed 1 diminutive adenoma which was removed as well as sigmoid diverticulosis.  Follow-up in 5 years recommended.  Anoscopy revealed distal esophagitis as well as a peptic stricture and moderate hiatal hernia.  The stricture was balloon dilated up to 20 mm.  She was prescribed pantoprazole 40 mg daily.  At that time she does report having had what sounds like a viral gastroenteritis with nausea, vomiting, diarrhea.  Now resolved.  She is tolerating her PPI well.  No reflux symptoms.  No further dysphagia.  She has changed her diet significantly lost 10+ pounds.  Reports larger more bulky bowel movements.  Also reports chronic belching.  She is under a great deal of stress around admission.  REVIEW OF SYSTEMS:  All non-GI ROS negative except for anxiety, confusion, itching, sleeping problems  Past Medical History:  Diagnosis Date   Allergic rhinitis    Asthma    in the past/ no meds   Blood transfusion without reported diagnosis    as a child   Fibroid    Hyperlipidemia     Past Surgical History:  Procedure Laterality Date   BREAST REDUCTION SURGERY     Bil/   CHOLECYSTECTOMY     FOOT SURGERY  2018   left foot surgery to shorten toe and remove damaged nerves/ still have pain/Dr Hewitt   valve in urteter     to control urine reflux/ age 24   WISDOM TOOTH EXTRACTION      Social History Hannah [name]  reports that she has never  smoked. She has never used smokeless tobacco. She reports current alcohol use. She reports that she does not use drugs.  family history includes Alcohol abuse in her father and mother; Breast cancer in her mother; Diabetes in her mother; Drug abuse in her brother; Heart disease in her father; Heart disease (age of onset: 6) in her brother; Kidney disease in her mother; Stroke in her mother.  Allergies  Allergen Reactions   Other Anaphylaxis    Blueberries and walnut    Peanut-Containing Drug Products Anaphylaxis    Walnuts, blueberries   Testosterone Hives    hives   Tree Extract Anaphylaxis, Diarrhea and Hives    Other Reaction(s): flushing, irregular heart rate, vomiting   Vaccinium Angustifolium Anaphylaxis   Aspirin     REACTION: bruises very easily   Dairy Aid [Tilactase] Other (See Comments)    Patient denies allergy to dairy   Codeine     itching  Other Reaction(s): Unknown   Grass Pollen(K-O-R-T-Swt Vern) Itching    itching   Molds & Smuts Other (See Comments)    Runny nose   Pollen Extract Itching    Other Reaction(s): Not available   Voltaren [Diclofenac] Itching    Voltaren gel- makes her feel weird       PHYSICAL EXAMINATION: Vital signs: BP 122/62   Pulse 65   Ht 5\' 3"  (1.6 m)   Wt 126 lb (57.2 kg)   BMI 22.32 kg/m  General:  Well-developed, well-nourished, no acute distress HEENT: Sclerae are anicteric. Abdomen: Not reexamined. Extremities: No visible extremities Psychiatric: alert and oriented x3. Cooperative   ASSESSMENT:  1.  GERD complicated by esophagitis and peptic stricture.  Asymptomatic post dilation on PPI 2.  Belching.  Likely secondary to aerophagia from stress 3.  History of numerous colon polyps.  Surveillance up-to-date   PLAN:  1.  Reflux precautions 2.  Reassurance regarding belching 3.  Pantoprazole 40 mg daily.  Prescription refilled.  Medication risks reviewed. 4.  Colonoscopy around January 2030 5.  Routine office  follow-up 1 year.  Contact the office in the interim for any questions once. A total time of 30 minutes was spent preparing to see the patient, obtaining interval history, physical examination, ordering medication, defining follow-up parameters, counseling and educating the patient regarding above listed issues, and documenting information in the health record.

## 2024-03-11 DIAGNOSIS — H2513 Age-related nuclear cataract, bilateral: Secondary | ICD-10-CM | POA: Diagnosis not present

## 2024-03-11 DIAGNOSIS — H5310 Unspecified subjective visual disturbances: Secondary | ICD-10-CM | POA: Diagnosis not present

## 2024-03-20 ENCOUNTER — Telehealth: Payer: Self-pay | Admitting: Physician Assistant

## 2024-03-20 NOTE — Telephone Encounter (Signed)
 Patient called earlier this afternoon with complaints of 3-day history of lower abdominal pain, cramping and noticed some mucousy heme with her bowel movement today.  Ate mushrooms on 03/17/2024 that she thought may have caused problems She says this feels similar to an episode she had a few months ago which was felt to be an infection.  She is wondering if she has diverticulitis. She has not had any fever or chills, no nausea or vomiting, able to eat.  She is out of town in [place] currently and wonders if something can be called in.  Sent a prescription to [facility] for Cipro  500 mg p.o. twice daily x 7 days Also new Rx for Bentyl 10 mg p.o. 3 times daily as needed for abdominal pain/cramping #30 no refills   Advised patient if she does not feel better in the next couple of days that she may need to be seen.

## 2024-03-22 NOTE — Telephone Encounter (Signed)
 Thanks Amy.  Dottie, Please check on this patient, who is a friend of mine. Thanks, JP

## 2024-03-23 NOTE — Telephone Encounter (Signed)
 Left message for patient to call back

## 2024-03-23 NOTE — Telephone Encounter (Signed)
Thanks for following up.

## 2024-03-23 NOTE — Telephone Encounter (Addendum)
 Spoke to patient who indicates that over the weekend, she began having sharp RLQ abdominal pain/cramping, nearly in the same place she had pain with previous diverticulitis flare. She states that pain seems to have been precipitated by eating a porto bella mushroom. Was having mucus and blood in her stools. Patient began taking Cipro  the evening of 03/20/24 and says she is starting to feel better. She took dicyclomine once yesterday but has not needed to take any since as she has not had any additional cramping. She denies any fever, n/v. She does say that she did have another episode earlier today of mucus and brb without stool.   Patient is advised that should she develop worsening abdominal pain, continued bloody bowel movements/mucus, fever, she should make us  aware.

## 2024-06-24 ENCOUNTER — Telehealth: Payer: Self-pay | Admitting: Internal Medicine

## 2024-06-24 NOTE — Telephone Encounter (Signed)
 Inbound call from patient stating that she believes she is having issues with possible diverticulitis and is not sure what she needs to do. Patient is requesting a call back to discuss. Please advise.

## 2024-06-24 NOTE — Progress Notes (Unsigned)
 06/25/2024 Hannah [name] [id number] October 07, 1956  Referring provider: Stephane Leita DEL, MD Primary GI doctor: Dr. Abran  ASSESSMENT AND PLAN:  Lower abdominal pain with rectal pressure/pain, soft stools 4-5 x a day, mucus with BRB on TP/mucus, nocturnal cramping Having urinary urgency, no dysuria No fever, chills 07/2023 [facility]AP[facility] Acute uncomplicated diverticulitis  recent colonoscopy unremarkable 10/2023 Suspicious for diverticulitis/colitis with history and physical exam, patient hemodynamically stable - stool samples to rule out infection with history of Cdiff - get CBC, CMET, and sed rate., urine/urine culture -Pending labs, will schedule for CT AB and pelvis but no rebound tenderness -IBGARD daily, will give Bentyl as needed, heating pad and liquid diet. - Prescribed Augmentin  - ER precautions discussed with the patient -close follow up  GERD with history of peptic stricture  moderate HH, dysphagia S/p cholecystectomy  11/27/2023 EGD due to abnormal CT ( 07/2023)  with thickened esophagus showed distal esophagitis, peptic stricture moderate HH status post dilation 20 mm  Personal history of tubular adenomatous polyps 11/27/2023 colonoscopy 1 diminutive adenomatous polyps, sigmoid diverticulosis recall 5 years  Patient Care Team: Stephane Leita DEL, MD as PCP - General (Internal Medicine) Burnard Debby LABOR, MD (Inactive) as PCP - Cardiology (Cardiology) Edith, Debby BROCKS, MD (Cardiology) Rosalynn LELON Ingle, MD (Inactive) as Consulting Physician (Obstetrics and Gynecology) Lavoie, Marie-Lyne, MD as Consulting Physician (Obstetrics and Gynecology)  HISTORY OF PRESENT ILLNESS: 68 y.o. female with a past medical history listed below presents for evaluation of AB pain/diarrhea.   Last seen in the office by Dr. Abran on 02/10/24 for GERD with history of peptic stricture requiring esophageal dilation.  Discussed the use of AI scribe software for clinical note transcription with the patient,  who gave verbal consent to proceed.  History of Present Illness   Hannah [name] is a 68 year old female with diverticulitis who presents with abdominal cramping and rectal bleeding.  She has been experiencing abdominal cramping across her abdomen, similar to a previous episode of diverticulitis confirmed by CT in October 2024. The cramping began a couple of weeks ago, initially resolving but then returning with increased intensity. The pain is diffuse and she reports tenderness in the rectal area.  Her bowel movements have become softer than usual, occurring four to five times a day, which is more frequent than her normal pattern. She notes blood-tinged mucus around her bowel movements, with blood appearing on tissue when she wipes. No nausea or vomiting is present.  She reports increased urgency to urinate but denies any burning or pain during urination. She has not experienced any fevers or chills. Cramping has been severe enough to wake her up at night.  She recalls taking antibiotics for a previous episode of diverticulitis in October 2024 and has not started any new medications in the last six months. She has used dicyclomine for cramping, which provided relief when taken at night.  She has a history of C. difficile infection contracted while caring for her mother, who had the infection.      She  reports that she has never smoked. She has never used smokeless tobacco. She reports current alcohol  use. She reports that she does not use drugs.  RELEVANT GI HISTORY, IMAGING AND LABS: Results   RADIOLOGY Abdominal CT: Uncomplicated diverticulitis (07/2023)  DIAGNOSTIC Colonoscopy: Diverticulosis (11/27/2023) Rectal examination: Internal hemorrhoid (06/25/2024)      CBC    Component Value Date/Time   WBC 4.1 01/12/2018 0825   WBC 4.1 01/12/2018 0825  WBC 3.8 (L) 03/26/2017 0849   RBC 4.82 01/12/2018 0825   RBC 4.73 01/12/2018 0825   RBC 4.55 03/26/2017 0849   HGB 14.7  01/12/2018 0825   HGB 14.5 01/12/2018 0825   HCT 42.8 01/12/2018 0825   HCT 44.7 01/12/2018 0825   PLT 185 01/12/2018 0825   MCV 89 01/12/2018 0825   MCV 95 01/12/2018 0825   MCH 30.5 01/12/2018 0825   MCH 30.7 01/12/2018 0825   MCHC 34.3 01/12/2018 0825   MCHC 32.4 01/12/2018 0825   MCHC 34.2 03/26/2017 0849   RDW 14.2 01/12/2018 0825   RDW 14.5 01/12/2018 0825   LYMPHSABS 1.5 01/12/2018 0825   MONOABS 0.2 03/19/2016 0817   EOSABS 0.1 01/12/2018 0825   BASOSABS 0.0 01/12/2018 0825   No results for input(s): HGB in the last 8760 hours.  CMP     Component Value Date/Time   NA 138 08/16/2019 1047   NA 140 01/12/2018 0825   K 4.5 08/16/2019 1047   CL 100 08/16/2019 1047   CO2 27 08/16/2019 1047   GLUCOSE 76 08/16/2019 1047   BUN 17 08/16/2019 1047   BUN 17 01/12/2018 0825   CREATININE 0.68 08/16/2019 1047   CALCIUM  9.3 08/16/2019 1047   PROT 6.1 11/20/2022 0840   ALBUMIN 4.2 11/20/2022 0840   AST 29 11/20/2022 0840   ALT 28 11/20/2022 0840   ALKPHOS 79 11/20/2022 0840   BILITOT 0.4 11/20/2022 0840   GFRNONAA 86 01/12/2018 0825   GFRAA 99 01/12/2018 0825      Latest Ref Rng & Units 11/20/2022    8:40 AM 08/16/2019   10:47 AM 01/12/2018    8:25 AM  Hepatic Function  Total Protein 6.0 - 8.5 g/dL 6.1  6.5  6.3   Albumin 3.9 - 4.9 g/dL 4.2  4.5  4.3   AST 0 - 40 IU/L 29  26  28    ALT 0 - 32 IU/L 28  20  26    Alk Phosphatase 44 - 121 IU/L 79  72  82   Total Bilirubin 0.0 - 1.2 mg/dL 0.4  0.7  0.3   Bilirubin, Direct 0.00 - 0.40 mg/dL 9.86         Current Medications:    Current Outpatient Medications (Cardiovascular):    EPINEPHrine  0.3 mg/0.3 mL IJ SOAJ injection, Inject 0.3 mLs (0.3 mg total) into the muscle as needed for anaphylaxis.   rosuvastatin  (CRESTOR ) 10 MG tablet, TAKE 1 TABLET(10 MG) BY MOUTH DAILY  Current Outpatient Medications (Respiratory):    albuterol  (VENTOLIN  HFA) 108 (90 Base) MCG/ACT inhaler, SMARTSIG:1 Puff(s) By Mouth Every 4 Hours PRN  (Patient taking differently: as needed for wheezing or shortness of breath.)   Current Outpatient Medications (Hematological):    Cyanocobalamin  (B-12 PO), Take by mouth.  Current Outpatient Medications (Other):    amoxicillin -clavulanate (AUGMENTIN ) 875-125 MG tablet, Take 1 tablet by mouth 2 (two) times daily.   CALCIUM  PO,    Cholecalciferol (VITAMIN D3 PO), Take 2,000 Units by mouth daily.    Melatonin 1 MG CAPS, Take by mouth.   pantoprazole  (PROTONIX ) 40 MG tablet, Take 1 tablet (40 mg total) by mouth daily.  Medical History:  Past Medical History:  Diagnosis Date   Allergic rhinitis    Asthma    in the past/ no meds   Blood transfusion without reported diagnosis    as a child   Fibroid    Hyperlipidemia    Allergies:  Allergies  Allergen Reactions  Justicia Adhatoda Anaphylaxis, Diarrhea, Rash, Hives, Palpitations and Nausea Only   Other Anaphylaxis    Blueberries and walnut    Peanut-Containing Drug Products Anaphylaxis    Walnuts, blueberries   Testosterone  Hives    hives   Tree Extract Anaphylaxis, Diarrhea and Hives    Other Reaction(s): flushing, irregular heart rate, vomiting   Vaccinium Angustifolium Anaphylaxis   Aspirin     REACTION: bruises very easily   Dairy Aid [Tilactase] Other (See Comments)    Patient denies allergy to dairy   Codeine     itching  Other Reaction(s): Unknown   Grass Pollen(K-O-R-T-Swt Vern) Itching    itching   Molds & Smuts Other (See Comments)    Runny nose   Pollen Extract Itching    Other Reaction(s): Not available   Voltaren  [Diclofenac ] Itching    Voltaren  gel- makes her feel weird     Surgical History:  She  has a past surgical history that includes Cholecystectomy; Wisdom tooth extraction; valve in urteter; Breast reduction surgery; and Foot surgery (2018). Family History:  Her family history includes Alcohol  abuse in her father and mother; Breast cancer in her mother; Diabetes in her mother; Drug abuse in her  brother; Heart disease in her father; Heart disease (age of onset: 63) in her brother; Kidney disease in her mother; Stroke in her mother.  REVIEW OF SYSTEMS  : All other systems reviewed and negative except where noted in the History of Present Illness.  PHYSICAL EXAM: BP 130/70   Pulse 81   Ht 5' 2 (1.575 m)   Wt 131 lb 6 oz (59.6 kg)   BMI 24.03 kg/m  Physical Exam   GENERAL APPEARANCE: Well nourished, in no apparent distress. HEENT: No cervical lymphadenopathy, unremarkable thyroid , sclerae anicteric, conjunctiva pink. RESPIRATORY: Respiratory effort normal, breath sounds equal bilaterally without rales, rhonchi, or wheezing. Lungs clear to auscultation bilaterally. CARDIO: Regular rate and rhythm with no murmurs, rubs, or gallops, peripheral pulses intact. ABDOMEN: Soft, non-distended, active bowel sounds in all four quadrants, tender to palpation without rebound tenderness, no mass appreciated. RECTAL: Internal hemorrhoid present, no rectal masses palpated. MUSCULOSKELETAL: Full range of motion, normal gait, without edema. SKIN: Dry, intact without rashes or lesions. No jaundice. NEURO: Alert, oriented, no focal deficits. PSYCH: Cooperative, normal mood and affect.      Alan JONELLE Coombs, PA-C 11:28 AM

## 2024-06-24 NOTE — Telephone Encounter (Signed)
 Pt states she has been having abdominal cramping in her abdomen along with pressure and aching in the rectal area. States she has passed some mucous in the stool and had some blood tinged color on the toilet paper. Reports her stool has been formed but softer than normal. She is concerned she may have diverticulitis again. Pt scheduled to see Alan Coombs PA 06/25/24@10 :40am. Pt aware of appt.

## 2024-06-25 ENCOUNTER — Encounter: Payer: Self-pay | Admitting: Physician Assistant

## 2024-06-25 ENCOUNTER — Ambulatory Visit (INDEPENDENT_AMBULATORY_CARE_PROVIDER_SITE_OTHER): Admitting: Physician Assistant

## 2024-06-25 ENCOUNTER — Other Ambulatory Visit

## 2024-06-25 VITALS — BP 130/70 | HR 81 | Ht 62.0 in | Wt 131.4 lb

## 2024-06-25 DIAGNOSIS — R197 Diarrhea, unspecified: Secondary | ICD-10-CM | POA: Diagnosis not present

## 2024-06-25 DIAGNOSIS — Z860101 Personal history of adenomatous and serrated colon polyps: Secondary | ICD-10-CM

## 2024-06-25 DIAGNOSIS — R1032 Left lower quadrant pain: Secondary | ICD-10-CM

## 2024-06-25 DIAGNOSIS — R195 Other fecal abnormalities: Secondary | ICD-10-CM

## 2024-06-25 DIAGNOSIS — Z9049 Acquired absence of other specified parts of digestive tract: Secondary | ICD-10-CM

## 2024-06-25 DIAGNOSIS — K625 Hemorrhage of anus and rectum: Secondary | ICD-10-CM

## 2024-06-25 DIAGNOSIS — K219 Gastro-esophageal reflux disease without esophagitis: Secondary | ICD-10-CM

## 2024-06-25 DIAGNOSIS — R3915 Urgency of urination: Secondary | ICD-10-CM

## 2024-06-25 DIAGNOSIS — R1030 Lower abdominal pain, unspecified: Secondary | ICD-10-CM | POA: Diagnosis not present

## 2024-06-25 DIAGNOSIS — K6289 Other specified diseases of anus and rectum: Secondary | ICD-10-CM | POA: Diagnosis not present

## 2024-06-25 LAB — COMPREHENSIVE METABOLIC PANEL WITH GFR
ALT: 12 U/L (ref 0–35)
AST: 15 U/L (ref 0–37)
Albumin: 4.1 g/dL (ref 3.5–5.2)
Alkaline Phosphatase: 72 U/L (ref 39–117)
BUN: 14 mg/dL (ref 6–23)
CO2: 29 meq/L (ref 19–32)
Calcium: 9.1 mg/dL (ref 8.4–10.5)
Chloride: 100 meq/L (ref 96–112)
Creatinine, Ser: 0.69 mg/dL (ref 0.40–1.20)
GFR: 88.81 mL/min (ref >60.00)
Glucose, Bld: 96 mg/dL (ref 70–99)
Potassium: 4.4 meq/L (ref 3.5–5.1)
Sodium: 139 meq/L (ref 135–145)
Total Bilirubin: 0.8 mg/dL (ref 0.2–1.2)
Total Protein: 7.3 g/dL (ref 6.0–8.3)

## 2024-06-25 LAB — URINALYSIS, ROUTINE W REFLEX MICROSCOPIC
Nitrite: NEGATIVE
Specific Gravity, Urine: 1.02 (ref 1.000–1.030)
Urine Glucose: NEGATIVE
Urobilinogen, UA: 1 (ref 0.0–1.0)
pH: 6.5 (ref 5.0–8.0)

## 2024-06-25 LAB — CBC WITH DIFFERENTIAL/PLATELET
Basophils Absolute: 0 K/uL (ref 0.0–0.1)
Basophils Relative: 0.4 % (ref 0.0–3.0)
Eosinophils Absolute: 0 K/uL (ref 0.0–0.7)
Eosinophils Relative: 0.3 % (ref 0.0–5.0)
HCT: 41.5 % (ref 36.0–46.0)
Hemoglobin: 13.9 g/dL (ref 12.0–15.0)
Lymphocytes Relative: 16.5 % (ref 12.0–46.0)
Lymphs Abs: 1.1 K/uL (ref 0.7–4.0)
MCHC: 33.4 g/dL (ref 30.0–36.0)
MCV: 89.1 fl (ref 78.0–100.0)
Monocytes Absolute: 0.6 K/uL (ref 0.1–1.0)
Monocytes Relative: 9 % (ref 3.0–12.0)
Neutro Abs: 4.9 K/uL (ref 1.4–7.7)
Neutrophils Relative %: 73.8 % (ref 43.0–77.0)
Platelets: 200 K/uL (ref 150.0–400.0)
RBC: 4.66 Mil/uL (ref 3.87–5.11)
RDW: 13.8 % (ref 11.5–15.5)
WBC: 6.7 K/uL (ref 4.0–10.5)

## 2024-06-25 LAB — SEDIMENTATION RATE: Sed Rate: 30 mm/h (ref 0–30)

## 2024-06-25 MED ORDER — AMOXICILLIN-POT CLAVULANATE 875-125 MG PO TABS: 1.0000 | ORAL_TABLET | Freq: Two times a day (BID) | ORAL | 0 refills | Status: DC

## 2024-06-25 NOTE — Progress Notes (Signed)
 Noted

## 2024-06-25 NOTE — Patient Instructions (Addendum)
 _______________________________________________________  If your blood pressure at your visit was 140/90 or greater, please contact your primary care physician to follow up on this.  _______________________________________________________  If you are age 69 or older, your body mass index should be between 23-30. Your Body mass index is 24.03 kg/m. If this is out of the aforementioned range listed, please consider follow up with your Primary Care Provider.  If you are age 11 or younger, your body mass index should be between 19-25. Your Body mass index is 24.03 kg/m. If this is out of the aformentioned range listed, please consider follow up with your Primary Care Provider.   ________________________________________________________  The [facility] providers would like to encourage you to use MYCHART to communicate with providers for non-urgent requests or questions.  Due to long hold times on the telephone, sending your provider a message by [facility] may be a faster and more efficient way to get a response.  Please allow 48 business hours for a response.  Please remember that this is for non-urgent requests.  _______________________________________________________  [facility] is using a team-based approach to care.  Your team is made up of your doctor and two to three APPS. Our APPS (Nurse Practitioners and Physician Assistants) work with your physician to ensure care continuity for you. They are fully qualified to address your health concerns and develop a treatment plan. They communicate directly with your gastroenterologist to care for you. Seeing the Advanced Practice Practitioners on your physician's team can help you by facilitating care more promptly, often allowing for earlier appointments, access to diagnostic testing, procedures, and other specialty referrals.   Your provider has requested that you go to the basement level for lab work before leaving today. Press B on the  elevator. The lab is located at the first door on the left as you exit the elevator.   Will give Augmentin   Can take dicyclomine at least 1-2 x a day for pain if needed.   Can do heating pad and can take tylenol max of 3000mg  a day.  Can add on lidocaine patches or voltern gel Go to the ER if unable to pass gas, severe AB pain, unable to hold down food, any shortness of breath of chest pain.   During an Acute Flare-Up (Clear Liquid to Low-Fiber Diet) The goal is to reduce irritation and let your colon rest.  Day 1-3: Clear Liquid Diet Water  Broth (chicken, beef, or vegetable)  Clear juices (apple, white grape - avoid citrus)  Ice pops without pulp or seeds  Gelatin (no fruit or seeds)  Tea or coffee (no cream or dairy)  After Symptoms Improve: Low-Fiber Diet Gradually transition to low-fiber foods for easier digestion.  Sample Foods White rice, pasta, or plain white bread  Cooked or canned vegetables without skins or seeds (e.g., carrots, green beans, potatoes)  Eggs, fish, or poultry  Low-fiber cereals (like cornflakes)  Dairy (if tolerated)  Ripe bananas, melon, or canned fruit without seeds  Long-Term Maintenance: High-Fiber Diet (Once Fully Recovered) This helps prevent future flare-ups by keeping the bowel movements soft and regular.  High-Fiber Foods Fruits: Apples (peeled), pears, berries, prunes  Vegetables: Broccoli, spinach, zucchini, peas  Whole grains: Oatmeal, brown rice, quinoa, whole wheat bread  Legumes: Lentils, chickpeas, black beans (start slowly to avoid gas)  Nuts & seeds: Only if tolerated (research no longer restricts them, but if you feel they cause a flare do not eat them)    Diverticulitis Diverticulitis is inflammation or  infection of small pouches in your colon that form when you have a condition called diverticulosis. The pouches in your colon are called diverticula. Your colon, or large intestine, is where water is absorbed and  stool is formed. Complications of diverticulitis can include: Bleeding. Severe infection. Severe pain. Perforation of your colon. Obstruction of your colon.  What are the causes? Diverticulitis is caused by bacteria. Diverticulitis happens when stool becomes trapped in diverticula. This allows bacteria to grow in the diverticula, which can lead to inflammation and infection. What increases the risk? People with diverticulosis are at risk for diverticulitis. Eating a diet that does not include enough fiber from fruits and vegetables may make diverticulitis more likely to develop. What are the signs or symptoms? Symptoms of diverticulitis may include: Abdominal pain and tenderness. The pain is normally located on the left side of the abdomen, but may occur in other areas. Fever and chills. Bloating. Cramping. Nausea. Vomiting. Constipation. Diarrhea. Blood in your stool.  How is this diagnosed? Your health care provider will ask you about your medical history and do a physical exam. You may need to have tests done because many medical conditions can cause the same symptoms as diverticulitis. Tests may include: Blood tests. Urine tests. Imaging tests of the abdomen, including X-rays and CT scans.  When your condition is under control, your health care provider may recommend that you have a colonoscopy. A colonoscopy can show how severe your diverticula are and whether something else is causing your symptoms. How is this treated? Most cases of diverticulitis are mild and can be treated at home. Treatment may include: Taking over-the-counter pain medicines. Following a clear liquid diet. Taking antibiotic medicines by mouth for 7-10 days.  More severe cases may be treated at a hospital. Treatment may include: Not eating or drinking. Taking prescription pain medicine. Receiving antibiotic medicines through an IV tube. Receiving fluids and nutrition through an IV  tube. Surgery.  Follow these instructions at home: Follow your health care provider's instructions carefully. Follow a full liquid diet or other diet as directed by your health care provider. After your symptoms improve, your health care provider may tell you to change your diet. He or she may recommend you eat a high-fiber diet. Fruits and vegetables are good sources of fiber. Fiber makes it easier to pass stool. Take fiber supplements or probiotics as directed by your health care provider. Only take medicines as directed by your health care provider. Keep all your follow-up appointments. Contact a health care provider if: Your pain does not improve. You have a hard time eating food. Your bowel movements do not return to normal. Get help right away if: Your pain becomes worse. Your symptoms do not get better. Your symptoms suddenly get worse. You have a fever. You have repeated vomiting. You have bloody or black, tarry stools. This information is not intended to replace advice given to you by your health care provider. Make sure you discuss any questions you have with your health care provider. Document Released: 07/24/2005 Document Revised: 03/21/2016 Document Reviewed: 09/08/2013 Elsevier Interactive Patient Education  [employer].  Due to recent changes in healthcare laws, you may see the results of your imaging and laboratory studies on MyChart before your provider has had a chance to review them.  We understand that in some cases there may be results that are confusing or concerning to you. Not all laboratory results come back in the same time frame and the provider may be  waiting for multiple results in order to interpret others.  Please give us  48 hours in order for your provider to thoroughly review all the results before contacting the office for clarification of your results.   Thank you for entrusting me with your care and choosing [facility].  Alan Coombs,  PA-C

## 2024-06-28 LAB — URINE CULTURE
MICRO NUMBER:: 16902338
SPECIMEN QUALITY:: ADEQUATE

## 2024-06-29 ENCOUNTER — Telehealth: Payer: Self-pay | Admitting: Physician Assistant

## 2024-06-29 ENCOUNTER — Ambulatory Visit: Payer: Self-pay | Admitting: Physician Assistant

## 2024-07-12 DIAGNOSIS — H2513 Age-related nuclear cataract, bilateral: Secondary | ICD-10-CM | POA: Diagnosis not present

## 2024-08-06 ENCOUNTER — Ambulatory Visit: Admitting: Physician Assistant

## 2024-08-06 NOTE — Progress Notes (Signed)
 08/09/2024 Fortune Torosian Musson [id number] [birth year]  Referring provider: Stephane Leita DEL, MD Primary GI doctor: Dr. Abran  ASSESSMENT AND PLAN:  IBS-D AB pain/bloating x months Unable to take viberzi, can not tolerate TCA Stressful job for 3 years has resolved She is burping frequently/passing gas 07/2023 CTAPW Acute uncomplicated diverticulitis recent colonoscopy unremarkable 10/2023 Diatherix  negative 06/29/2024 for infection Prescribed Augmentin  06/25/2024 for suspicion of recurrent diverticulitis, urine unremarkable CBC sed rate negative Possible post infectious IBS, IBS-D, SIBO, pelvic floor dysfunction - Provide IBS diet information. - Prescribe Xifaxan for IBS with diarrhea if insurance covers it, if not trial of flagyl  250mg  TID for 10 days - Provide information on pelvic floor dysfunction and consider referral to pelvic floor physical therapy.  GERD with history of peptic stricture  moderate HH, dysphagia S/p cholecystectomy  11/27/2023 EGD due to abnormal CT ( 07/2023)  with thickened esophagus showed distal esophagitis, peptic stricture moderate HH status post dilation 20 mm No symptoms at this time  Personal history of tubular adenomatous polyps 11/27/2023 colonoscopy 1 diminutive adenomatous polyps, sigmoid diverticulosis recall 5 years   Patient Care Team: Stephane Leita DEL, MD as PCP - General (Internal Medicine) Burnard Debby LABOR, MD (Inactive) as PCP - Cardiology (Cardiology) Edith, Debby BROCKS, MD (Cardiology) Rosalynn LELON Ingle, MD (Inactive) as Consulting Physician (Obstetrics and Gynecology) Lavoie, Marie-Lyne, MD as Consulting Physician (Obstetrics and Gynecology)  HISTORY OF PRESENT ILLNESS: 68 y.o. female with a past medical history listed below presents for evaluation of AB pain/diarrhea.   I last saw the patient in the office 06/25/2024 for abdominal pain and diarrhea.  Discussed the use of AI scribe software for clinical note transcription with the patient, who  gave verbal consent to proceed.  History of Present Illness   Hannah [name] is a 68 year old female with a history of diverticulitis who presents with ongoing gastrointestinal symptoms.  Her symptoms began at the end of August with abdominal pain and diarrhea. A CT scan in October confirmed diverticulitis, and a subsequent colonoscopy in January was normal. She was treated with Augmentin  for suspected recurrent diverticulitis, which improved her condition. Currently, she experiences inconsistent bowel movements, describing them as sometimes 'really skinny,' 'dark,' or 'light or brown.' She no longer has abdominal cramps but notes that her stools are not consistent. She reports frequent burping and flatulence, and mentions persistent halitosis.  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 She believes the stress may have impacted her gastrointestinal symptoms. Her eating habits have been irregular due to her work, involving rich foods, which she plans to change now that her job has ended.  She reports rectal bleeding when wiping but denies any burning, itching, or pain. She has bowel movements one to two times a day but often feels incomplete evacuation.  Her symptoms worsened after a trip to [place], where she took preventative antibiotics. She is concerned about a potential flare-up with an upcoming trip to [place].  She is currently in the process of updating her immunizations, having recently received the flu and COVID vaccines, and plans to get the pneumonia vaccine soon.      She  reports that she has never smoked. She has never used smokeless tobacco. She reports current alcohol  use. She reports that she does not use drugs.  RELEVANT GI HISTORY, IMAGING AND LABS: Results   RADIOLOGY Abdominal CT: Diverticulitis (07/2024)  DIAGNOSTIC REPORTS Colonoscopy: Normal (  10/2023)      CBC    Component Value  Date/Time   WBC 6.7 06/25/2024 1115   RBC 4.66 06/25/2024 1115   HGB 13.9 06/25/2024 1115   HGB 14.7 01/12/2018 0825   HGB 14.5 01/12/2018 0825   HCT 41.5 06/25/2024 1115   HCT 42.8 01/12/2018 0825   HCT 44.7 01/12/2018 0825   PLT 200.0 06/25/2024 1115   PLT 185 01/12/2018 0825   MCV 89.1 06/25/2024 1115   MCV 89 01/12/2018 0825   MCV 95 01/12/2018 0825   MCH 30.5 01/12/2018 0825   MCH 30.7 01/12/2018 0825   MCHC 33.4 06/25/2024 1115   RDW 13.8 06/25/2024 1115   RDW 14.2 01/12/2018 0825   RDW 14.5 01/12/2018 0825   LYMPHSABS 1.1 06/25/2024 1115   LYMPHSABS 1.5 01/12/2018 0825   MONOABS 0.6 06/25/2024 1115   EOSABS 0.0 06/25/2024 1115   EOSABS 0.1 01/12/2018 0825   BASOSABS 0.0 06/25/2024 1115   BASOSABS 0.0 01/12/2018 0825   Recent Labs    06/25/24 1115  HGB 13.9    CMP     Component Value Date/Time   NA 139 06/25/2024 1115   NA 140 01/12/2018 0825   K 4.4 06/25/2024 1115   CL 100 06/25/2024 1115   CO2 29 06/25/2024 1115   GLUCOSE 96 06/25/2024 1115   BUN 14 06/25/2024 1115   BUN 17 01/12/2018 0825   CREATININE 0.69 06/25/2024 1115   CALCIUM  9.1 06/25/2024 1115   PROT 7.3 06/25/2024 1115   PROT 6.1 11/20/2022 0840   ALBUMIN 4.1 06/25/2024 1115   ALBUMIN 4.2 11/20/2022 0840   AST 15 06/25/2024 1115   ALT 12 06/25/2024 1115   ALKPHOS 72 06/25/2024 1115   BILITOT 0.8 06/25/2024 1115   BILITOT 0.4 11/20/2022 0840   GFRNONAA 86 01/12/2018 0825   GFRAA 99 01/12/2018 0825      Latest Ref Rng & Units 06/25/2024   11:15 AM 11/20/2022    8:40 AM 08/16/2019   10:47 AM  Hepatic Function  Total Protein 6.0 - 8.3 g/dL 7.3  6.1  6.5   Albumin 3.5 - 5.2 g/dL 4.1  4.2  4.5   AST 0 - 37 U/L 15  29  26    ALT 0 - 35 U/L 12  28  20    Alk Phosphatase 39 - 117 U/L 72  79  72   Total Bilirubin 0.2 - 1.2 mg/dL 0.8  0.4  0.7   Bilirubin, Direct 0.00 - 0.40 mg/dL  9.86        Current Medications:    Current Outpatient Medications (Cardiovascular):    EPINEPHrine  0.3  mg/0.3 mL IJ SOAJ injection, Inject 0.3 mLs (0.3 mg total) into the muscle as needed for anaphylaxis.   rosuvastatin  (CRESTOR ) 10 MG tablet, TAKE 1 TABLET(10 MG) BY MOUTH DAILY  Current Outpatient Medications (Respiratory):    albuterol  (VENTOLIN  HFA) 108 (90 Base) MCG/ACT inhaler, SMARTSIG:1 Puff(s) By Mouth Every 4 Hours PRN    Current Outpatient Medications (Other):    CALCIUM  PO,    Cholecalciferol (VITAMIN D3 PO), Take 2,000 Units by mouth daily.    dicyclomine (BENTYL) 10 MG capsule, Take 10 mg by mouth 3 (three) times daily as needed.   Melatonin 1 MG CAPS, Take by mouth.   pantoprazole  (PROTONIX ) 40 MG tablet, Take 1 tablet (40 mg total) by mouth daily.   rifaximin (XIFAXAN) 550 MG TABS tablet, Take 1 tablet (550 mg total) by mouth 3 (three) times daily for  14 days.  Medical History:  Past Medical History:  Diagnosis Date   Allergic rhinitis    Asthma    in the past/ no meds   Blood transfusion without reported diagnosis    as a child   Fibroid    Hyperlipidemia    Allergies:  Allergies  Allergen Reactions   Justicia Adhatoda Anaphylaxis, Diarrhea, Rash, Hives, Palpitations and Nausea Only   Other Anaphylaxis    Blueberries and walnut    Peanut-Containing Drug Products Anaphylaxis    Walnuts, blueberries   Testosterone  Hives    hives   Tree Extract Anaphylaxis, Diarrhea and Hives    Other Reaction(s): flushing, irregular heart rate, vomiting   Vaccinium Angustifolium Anaphylaxis   Aspirin     REACTION: bruises very easily   Dairy Aid [Tilactase] Other (See Comments)    Patient denies allergy to dairy   Codeine     itching  Other Reaction(s): Unknown   Grass Pollen(K-O-R-T-Swt Vern) Itching    itching   Molds & Smuts Other (See Comments)    Runny nose   Pollen Extract Itching    Other Reaction(s): Not available   Voltaren  [Diclofenac ] Itching    Voltaren  gel- makes her feel weird     Surgical History:  She  has a past surgical history that includes  Cholecystectomy; Wisdom tooth extraction; valve in urteter; Breast reduction surgery; and Foot surgery (2018). Family History:  Her family history includes Alcohol  abuse in her father and mother; Breast cancer in her mother; Diabetes in her mother; Drug abuse in her brother; Heart disease in her father; Heart disease (age of onset: 8) in her brother; Kidney disease in her mother; Stroke in her mother.  REVIEW OF SYSTEMS  : All other systems reviewed and negative except where noted in the History of Present Illness.  PHYSICAL EXAM: BP (!) 130/58   Pulse 73   Ht 5' 3 (1.6 m)   Wt 131 lb 8 oz (59.6 kg)   BMI 23.29 kg/m  Physical Exam   GENERAL APPEARANCE: Well nourished, in no apparent distress. HEENT: No cervical lymphadenopathy, unremarkable thyroid , sclerae anicteric, conjunctiva pink. RESPIRATORY: Respiratory effort normal, breath sounds equal bilaterally without rales, rhonchi, or wheezing. CARDIO: Regular rate and rhythm with no murmurs, rubs, or gallops, peripheral pulses intact. ABDOMEN: Soft, non-distended, active bowel sounds in all four quadrants, no tenderness to palpation, no rebound, no mass appreciated. RECTAL: Declines. MUSCULOSKELETAL: Full range of motion, normal gait, without edema. SKIN: Dry, intact without rashes or lesions. No jaundice. NEURO: Alert, oriented, no focal deficits. PSYCH: Cooperative, normal mood and affect.      Alan JONELLE Coombs, PA-C 12:06 PM

## 2024-08-09 ENCOUNTER — Encounter: Payer: Self-pay | Admitting: Physician Assistant

## 2024-08-09 ENCOUNTER — Ambulatory Visit: Admitting: Physician Assistant

## 2024-08-09 VITALS — BP 130/58 | HR 73 | Ht 63.0 in | Wt 131.5 lb

## 2024-08-09 DIAGNOSIS — K219 Gastro-esophageal reflux disease without esophagitis: Secondary | ICD-10-CM

## 2024-08-09 DIAGNOSIS — R1032 Left lower quadrant pain: Secondary | ICD-10-CM

## 2024-08-09 DIAGNOSIS — R140 Abdominal distension (gaseous): Secondary | ICD-10-CM

## 2024-08-09 DIAGNOSIS — R3915 Urgency of urination: Secondary | ICD-10-CM

## 2024-08-09 DIAGNOSIS — R142 Eructation: Secondary | ICD-10-CM | POA: Diagnosis not present

## 2024-08-09 DIAGNOSIS — K222 Esophageal obstruction: Secondary | ICD-10-CM

## 2024-08-09 DIAGNOSIS — K580 Irritable bowel syndrome with diarrhea: Secondary | ICD-10-CM

## 2024-08-09 DIAGNOSIS — Z9049 Acquired absence of other specified parts of digestive tract: Secondary | ICD-10-CM

## 2024-08-09 DIAGNOSIS — Z860101 Personal history of adenomatous and serrated colon polyps: Secondary | ICD-10-CM

## 2024-08-09 MED ORDER — RIFAXIMIN 550 MG PO TABS: 550.0000 mg | ORAL_TABLET | Freq: Three times a day (TID) | ORAL | 0 refills | Status: AC

## 2024-08-09 NOTE — Progress Notes (Signed)
 Noted

## 2024-08-09 NOTE — Patient Instructions (Addendum)
 First do a trial off milk/lactose products if you use them.  Add fiber like benefiber or citracel once a day Increase activity Can do trial of IBGard which is over the counter for AB pain- Take 1-2 capsules once a day for maintence or twice a day during a flare    FODMAP stands for fermentable oligo-, di-, mono-saccharides and polyols (1). These are the scientific terms used to classify groups of carbs that are difficult for our body to digest and that are notorious for triggering digestive symptoms like bloating, gas, loose stools and stomach pain.   You can try low FODMAP diet  - start with eliminating just one column at a time that you feel may be a trigger for you. - the table at the very bottom contains foods that are low in FODMAPs   Sometimes trying to eliminate the FODMAP's from your diet is difficult or tricky, if you are stuggling with trying to do the elimination diet you can try an enzyme.  There is a food enzymes that you sprinkle in or on your food that helps break down the FODMAP. You can read more about the enzyme by going to this site: https://fodzyme.com/   Small intestinal bacterial overgrowth (SIBO) occurs when there is an abnormal increase in the overall bacterial population in the small intestine -- particularly types of bacteria not commonly found in that part of the digestive tract. Small intestinal bacterial overgrowth (SIBO) commonly results when a circumstance -- such as surgery or disease -- slows the passage of food and waste products in the digestive tract, creating a breeding ground for bacteria.  Signs and symptoms of SIBO often include: Loss of appetite Abdominal pain Nausea Bloating An uncomfortable feeling of fullness after eating Diarrhea or constipation, depending on the type of gas produced  What foods trigger SIBO? While foods aren't the original cause of SIBO, certain foods do encourage the overgrowth of the wrong bacteria in your small  intestine. If you're feeding them their favorite foods, they're going to grow more, and that will trigger more of your SIBO symptoms. By the same token, you can help reduce the overgrowth by starving the problematic bacteria of their favorite foods. This strategy has led to a number of proposed SIBO eating plans. The plans vary, and so do individual results. But in general, they tend to recommend limiting carbohydrates.  These include: Sugars and sweeteners. Fruits and starchy vegetables. Dairy products. Grains.  There is a test for this we can do called a breath test, if you are positive we will treat you with an antibiotic to see if it helps.  Your symptoms are very suspicious for this condition, as discussed, we will start you on an antibiotic to see if this helps.   Here some information about pelvic floor dysfunction. This may be contributing to some of your symptoms. We will continue with our evaluation but I do want you to consider adding on fiber supplement with low-dose MiraLAX daily. We could also refer to pelvic floor physical therapy.   Pelvic Floor Dysfunction, Female Pelvic floor dysfunction (PFD) is a condition that results when the group of muscles and connective tissues that support the organs in the pelvis (pelvic floor muscles) do not work well. These muscles and their connections form a sling that supports the colon and bladder. In women, they also support the uterus. PFD causes pelvic floor muscles to be too weak, too tight, or both. In PFD, muscle movements are not coordinated. This  may cause bowel or bladder problems. It may also cause pain. What are the causes? This condition may be caused by an injury to the pelvic area or by a weakening of pelvic muscles. This often results from pregnancy and childbirth or other types of strain. In many cases, the exact cause is not known. What increases the risk? The following factors may make you more likely to develop this  condition: Having chronic bladder tissue inflammation (interstitial cystitis). Being an older person. Being overweight. History of radiation treatment for cancer in the pelvic region. Previous pelvic surgery, such as removal of the uterus (hysterectomy). What are the signs or symptoms? Symptoms of this condition vary and may include: Bladder symptoms, such as: Trouble starting urination and emptying the bladder. Frequent urinary tract infections. Leaking urine when coughing, laughing, or exercising (stress incontinence). Having to pass urine urgently or frequently. Pain when passing urine. Bowel symptoms, such as: Constipation. Urgent or frequent bowel movements. Incomplete bowel movements. Painful bowel movements. Leaking stool or gas. Unexplained genital or rectal pain. Genital or rectal muscle spasms. Low back pain. Other symptoms may include: A heavy, full, or aching feeling in the vagina. A bulge that protrudes into the vagina. Pain during or after sex. How is this diagnosed? This condition may be diagnosed based on: Your symptoms and medical history. A physical exam. During the exam, your health care provider may check your pelvic muscles for tightness, spasm, pain, or weakness. This may include a rectal exam and a pelvic exam. In some cases, you may have diagnostic tests, such as: Electrical muscle function tests. Urine flow testing. X-ray tests of bowel function. Ultrasound of the pelvic organs. How is this treated? Treatment for this condition depends on the symptoms. Treatment options include: Physical therapy. This may include Kegel exercises to help relax or strengthen the pelvic floor muscles. Biofeedback. This type of therapy provides feedback on how tight your pelvic floor muscles are so that you can learn to control them. Internal or external massage therapy. A treatment that involves electrical stimulation of the pelvic floor muscles to help control pain  (transcutaneous electrical nerve stimulation, or TENS). Sound wave therapy (ultrasound) to reduce muscle spasms. Medicines, such as: Muscle relaxants. Bladder control medicines. Surgery to reconstruct or support pelvic floor muscles may be an option if other treatments do not help. Follow these instructions at home: Activity Do your usual activities as told by your health care provider. Ask your health care provider if you should modify any activities. Do pelvic floor strengthening or relaxing exercises at home as told by your physical therapist. Lifestyle Maintain a healthy weight. Eat foods that are high in fiber, such as beans, whole grains, and fresh fruits and vegetables. Limit foods that are high in fat and processed sugars, such as fried or sweet foods. Manage stress with relaxation techniques such as yoga or meditation. General instructions If you have problems with leakage: Use absorbable pads or wear padded underwear. Wash frequently with mild soap. Keep your genital and anal area as clean and dry as possible. Ask your health care provider if you should try a barrier cream to prevent skin irritation. Take warm baths to relieve pelvic muscle tension or spasms. Take over-the-counter and prescription medicines only as told by your health care provider. Keep all follow-up visits. How is this prevented? The cause of PFD is not always known, but there are a few things you can do to reduce the risk of developing this condition, including: Staying at a  healthy weight. Getting regular exercise. Managing stress. Contact a health care provider if: Your symptoms are not improving with home care. You have signs or symptoms of PFD that get worse at home. You develop new signs or symptoms. You have signs of a urinary tract infection, such as: Fever. Chills. Increased urinary frequency. A burning feeling when urinating. You have not had a bowel movement in 3 days  (constipation). Summary Pelvic floor dysfunction results when the muscles and connective tissues in your pelvic floor do not work well. These muscles and their connections form a sling that supports your colon and bladder. In women, they also support the uterus. PFD may be caused by an injury to the pelvic area or by a weakening of pelvic muscles. PFD causes pelvic floor muscles to be too weak, too tight, or a combination of both. Symptoms may vary from person to person. In most cases, PFD can be treated with physical therapies and medicines. Surgery may be an option if other treatments do not help. This information is not intended to replace advice given to you by your health care provider. Make sure you discuss any questions you have with your health care provider. Document Revised: 02/21/2021 Document Reviewed: 02/21/2021 Elsevier Patient Education  2022 [employer].  I appreciate the  opportunity to care for you  Thank You   [facility]

## 2024-08-23 DIAGNOSIS — L239 Allergic contact dermatitis, unspecified cause: Secondary | ICD-10-CM | POA: Diagnosis not present

## 2024-09-27 DIAGNOSIS — Z6823 Body mass index (BMI) 23.0-23.9, adult: Secondary | ICD-10-CM | POA: Diagnosis not present

## 2024-09-27 DIAGNOSIS — Z01419 Encounter for gynecological examination (general) (routine) without abnormal findings: Secondary | ICD-10-CM | POA: Diagnosis not present

## 2024-09-30 DIAGNOSIS — E538 Deficiency of other specified B group vitamins: Secondary | ICD-10-CM | POA: Diagnosis not present

## 2024-10-07 DIAGNOSIS — Z1331 Encounter for screening for depression: Secondary | ICD-10-CM | POA: Diagnosis not present

## 2024-10-07 DIAGNOSIS — E538 Deficiency of other specified B group vitamins: Secondary | ICD-10-CM | POA: Diagnosis not present

## 2024-10-07 DIAGNOSIS — Z0000 Encounter for general adult medical examination without abnormal findings: Secondary | ICD-10-CM | POA: Diagnosis not present

## 2024-10-07 DIAGNOSIS — Z1339 Encounter for screening examination for other mental health and behavioral disorders: Secondary | ICD-10-CM | POA: Diagnosis not present

## 2024-10-07 DIAGNOSIS — I6523 Occlusion and stenosis of bilateral carotid arteries: Secondary | ICD-10-CM | POA: Diagnosis not present

## 2024-10-22 ENCOUNTER — Encounter: Payer: Self-pay | Admitting: Internal Medicine

## 2024-10-22 ENCOUNTER — Ambulatory Visit: Admitting: Internal Medicine

## 2024-10-22 VITALS — BP 130/78 | HR 80 | Ht 63.0 in | Wt 137.0 lb

## 2024-10-22 DIAGNOSIS — K219 Gastro-esophageal reflux disease without esophagitis: Secondary | ICD-10-CM

## 2024-10-22 DIAGNOSIS — K222 Esophageal obstruction: Secondary | ICD-10-CM

## 2024-10-22 DIAGNOSIS — Z860101 Personal history of adenomatous and serrated colon polyps: Secondary | ICD-10-CM

## 2024-10-22 DIAGNOSIS — K312 Hourglass stricture and stenosis of stomach: Secondary | ICD-10-CM

## 2024-10-22 DIAGNOSIS — R142 Eructation: Secondary | ICD-10-CM

## 2024-10-22 DIAGNOSIS — K2100 Gastro-esophageal reflux disease with esophagitis, without bleeding: Secondary | ICD-10-CM

## 2024-10-22 DIAGNOSIS — Z860100 Personal history of colon polyps, unspecified: Secondary | ICD-10-CM

## 2024-10-22 MED ORDER — PANTOPRAZOLE SODIUM 40 MG PO TBEC: 40.0000 mg | DELAYED_RELEASE_TABLET | Freq: Every day | ORAL | 3 refills | Status: AC

## 2024-10-22 NOTE — Patient Instructions (Signed)
 We have sent the following medications to your pharmacy for you to pick up at your convenience:  Pantoprazole .  Please follow up in one year.  _______________________________________________________  If your blood pressure at your visit was 140/90 or greater, please contact your primary care physician to follow up on this.  _______________________________________________________  If you are age 69 or older, your body mass index should be between 23-30. Your Body mass index is 24.27 kg/m. If this is out of the aforementioned range listed, please consider follow up with your Primary Care Provider.  If you are age 28 or younger, your body mass index should be between 19-25. Your Body mass index is 24.27 kg/m. If this is out of the aformentioned range listed, please consider follow up with your Primary Care Provider.   ________________________________________________________  The [facility] providers would like to encourage you to use MYCHART to communicate with providers for non-urgent requests or questions.  Due to long hold times on the telephone, sending your provider a message by [facility] may be a faster and more efficient way to get a response.  Please allow 48 business hours for a response.  Please remember that this is for non-urgent requests.  _______________________________________________________  [facility] is using a team-based approach to care.  Your team is made up of your doctor and two to three APPS. Our APPS (Nurse Practitioners and Physician Assistants) work with your physician to ensure care continuity for you. They are fully qualified to address your health concerns and develop a treatment plan. They communicate directly with your gastroenterologist to care for you. Seeing the Advanced Practice Practitioners on your physician's team can help you by facilitating care more promptly, often allowing for earlier appointments, access to diagnostic testing, procedures, and  other specialty referrals.

## 2024-10-22 NOTE — Progress Notes (Signed)
 HISTORY OF PRESENT ILLNESS:  Hannah [name] is a 68 y.o. female with GERD complicated by peptic stricture requiring esophageal dilation and a history of multiple adenomatous colon polyps who presents today for follow-up.  I saw the patient in November 27, 2023.  Colonoscopy and upper endoscopy with esophageal dilation.  He was subsequently seen in the office February 10, 2024.  At that time she was doing well post dilation on PPI.  Her complaint at that time was belching and stress.  She was subsequently seen by the GI physician assistant August 09, 2024 for vague abdominal discomfort and bloating.  She was treated with Xifaxan .  She states that her symptoms resolved.  This follow-up appointment was previously arranged.  She is pleased to report that her reflux is under good control with pantoprazole  40 mg daily.  No recurrent dysphagia.  Still with some belching, in the morning.  No problems with abdominal discomfort and irregular bowels have also resolved.  She is little tired after [employer], but otherwise well.  She does have some non-GI questions regarding lipids and what sounds like cardiac calcium  scoring CT.  I have deferred to her PCP  REVIEW OF SYSTEMS:  All non-GI ROS negative except for anxiety, fatigue, itching, sleeping problems  Past Medical History:  Diagnosis Date   Allergic rhinitis    Asthma    in the past/ no meds   Blood transfusion without reported diagnosis    as a child   Fibroid    Hyperlipidemia     Past Surgical History:  Procedure Laterality Date   BREAST REDUCTION SURGERY     Bil/   CHOLECYSTECTOMY     FOOT SURGERY  2018   left foot surgery to shorten toe and remove damaged nerves/ still have pain/Dr Hewitt   valve in urteter     to control urine reflux/ age 54   WISDOM TOOTH EXTRACTION      Social History Hannah [name]  reports that she has never smoked. She has never used smokeless tobacco. She reports current alcohol  use. She  reports that she does not use drugs.  family history includes Alcohol  abuse in her father and mother; Breast cancer in her mother; Diabetes in her mother; Drug abuse in her brother; Heart disease in her father; Heart disease (age of onset: 56) in her brother; Kidney disease in her mother; Stroke in her mother.  Allergies[1]     PHYSICAL EXAMINATION: Vital signs: BP 130/78   Pulse 80   Ht 5' 3 (1.6 m)   Wt 137 lb (62.1 kg)   BMI 24.27 kg/m  General: Well-developed, well-nourished, no acute distress HEENT: Sclerae are anicteric, conjunctiva pink. Oral mucosa intact Lungs: Clear Heart: Regular Abdomen: soft, nontender, nondistended, no obvious ascites, no peritoneal signs, normal bowel sounds. No organomegaly. Extremities: No edema Psychiatric: alert and oriented x3. Cooperative   ASSESSMENT:  1.  GERD complicated by peptic stricture.  Asymptomatic post dilation on PPI 2.  Transient irregular bowel habits.  Resolved 3.  History of adenomatous colon polyps.  Surveillance up-to-date 4.  General Medical problems.  Under the care of Dr. Stephane   PLAN:  1.  Reflux precautions 2.  Continue pantoprazole  40 mg daily 3.  Routine office follow-up 1 year.  Contact the office in the interim for any questions or problems 4.  Surveillance colonoscopy around January 2030 5.  Ongoing general medical care with Dr. Stephane         [1]  Allergies  Allergen Reactions   Justicia Adhatoda Anaphylaxis, Diarrhea, Rash, Hives, Palpitations and Nausea Only   Other Anaphylaxis    Blueberries and walnut    Peanut-Containing Drug Products Anaphylaxis    Walnuts, blueberries   Testosterone  Hives    hives   Tree Extract Anaphylaxis, Diarrhea and Hives    Other Reaction(s): flushing, irregular heart rate, vomiting   Vaccinium Angustifolium Anaphylaxis   Aspirin     REACTION: bruises very easily   Dairy Aid [Tilactase] Other (See Comments)    Patient denies allergy to dairy   Codeine      itching  Other Reaction(s): Unknown   Grass Pollen(K-O-R-T-Swt Vern) Itching    itching   Molds & Smuts Other (See Comments)    Runny nose   Pollen Extract Itching    Other Reaction(s): Not available   Voltaren  [Diclofenac ] Itching    Voltaren  gel- makes her feel weird

## 2024-11-03 ENCOUNTER — Telehealth: Payer: Self-pay | Admitting: Internal Medicine

## 2024-11-03 NOTE — Telephone Encounter (Signed)
 Patient returning call  Requesting a back  Please advise  Thank you

## 2024-11-03 NOTE — Telephone Encounter (Signed)
"  Left message for pt to call back   "

## 2024-11-03 NOTE — Telephone Encounter (Signed)
 Pt returned call to [facility]. I attempted to reach you. Please advise. Thank you

## 2024-11-03 NOTE — Telephone Encounter (Signed)
 Inbound call to speak with a nurse. PT has had blood stains in her underwear the past 4 days and is very concerned. Please advise.

## 2024-11-04 ENCOUNTER — Other Ambulatory Visit: Payer: Self-pay | Admitting: Internal Medicine

## 2024-11-04 ENCOUNTER — Encounter: Payer: Self-pay | Admitting: Internal Medicine

## 2024-11-04 DIAGNOSIS — E785 Hyperlipidemia, unspecified: Secondary | ICD-10-CM

## 2024-11-04 NOTE — Telephone Encounter (Signed)
 Pt reports she has seen blood in her panties for the past 4 days. Asked pt if she has seen any blood on the toilet tissue and she states she did today. She denies seeing any blood in the stool. Pt requesting to be seen. Pt scheduled to see Dr Abran 11/16/24 at 11:40am. She is aware of appt.

## 2024-11-11 ENCOUNTER — Inpatient Hospital Stay: Admission: RE | Admit: 2024-11-11 | Discharge: 2024-11-11 | Attending: Internal Medicine

## 2024-11-11 DIAGNOSIS — E785 Hyperlipidemia, unspecified: Secondary | ICD-10-CM

## 2024-11-16 ENCOUNTER — Encounter: Payer: Self-pay | Admitting: Internal Medicine

## 2024-11-16 ENCOUNTER — Ambulatory Visit: Admitting: Internal Medicine

## 2024-11-16 VITALS — BP 117/70 | HR 62 | Ht 63.0 in | Wt 138.1 lb

## 2024-11-16 DIAGNOSIS — Z860100 Personal history of colon polyps, unspecified: Secondary | ICD-10-CM

## 2024-11-16 DIAGNOSIS — K219 Gastro-esophageal reflux disease without esophagitis: Secondary | ICD-10-CM | POA: Diagnosis not present

## 2024-11-16 DIAGNOSIS — K222 Esophageal obstruction: Secondary | ICD-10-CM

## 2024-11-16 DIAGNOSIS — R142 Eructation: Secondary | ICD-10-CM

## 2024-11-16 DIAGNOSIS — Z860101 Personal history of adenomatous and serrated colon polyps: Secondary | ICD-10-CM | POA: Diagnosis not present

## 2024-11-16 DIAGNOSIS — K625 Hemorrhage of anus and rectum: Secondary | ICD-10-CM

## 2024-11-16 MED ORDER — HYDROCORTISONE (PERIANAL) 2.5 % EX CREA: 1.0000 | TOPICAL_CREAM | Freq: Every evening | CUTANEOUS | 1 refills | Status: AC | PRN

## 2024-11-16 NOTE — Patient Instructions (Signed)
 We have sent the following medications to your pharmacy for you to pick up at your convenience:  Anusol  HC Cream  Purchase Preparation H suppositories over the counter.  Apply a pea sized amount of the Anusol  cream to a suppository and insert rectally.   Please follow up as needed.  _______________________________________________________  If your blood pressure at your visit was 140/90 or greater, please contact your primary care physician to follow up on this.  _______________________________________________________  If you are age 70 or older, your body mass index should be between 23-30. Your Body mass index is 24.47 kg/m. If this is out of the aforementioned range listed, please consider follow up with your Primary Care Provider.  If you are age 70 or younger, your body mass index should be between 19-25. Your Body mass index is 24.47 kg/m. If this is out of the aformentioned range listed, please consider follow up with your Primary Care Provider.   ________________________________________________________  The [facility] providers would like to encourage you to use MYCHART to communicate with providers for non-urgent requests or questions.  Due to long hold times on the telephone, sending your provider a message by [facility] may be a faster and more efficient way to get a response.  Please allow 48 business hours for a response.  Please remember that this is for non-urgent requests.  _______________________________________________________  [facility] is using a team-based approach to care.  Your team is made up of your doctor and two to three APPS. Our APPS (Nurse Practitioners and Physician Assistants) work with your physician to ensure care continuity for you. They are fully qualified to address your health concerns and develop a treatment plan. They communicate directly with your gastroenterologist to care for you. Seeing the Advanced Practice Practitioners on your  physician's team can help you by facilitating care more promptly, often allowing for earlier appointments, access to diagnostic testing, procedures, and other specialty referrals.

## 2024-11-16 NOTE — Progress Notes (Signed)
 HISTORY OF PRESENT ILLNESS:  Hannah [name] is a 69 y.o. female with past medical history as outlined below.  She is followed in this office for history of GERD complicated by peptic stricture requiring esophageal dilation as well as a history of multiple adenomatous colon polyps.  He presents today regarding rectal bleeding.  On November 27, 2023 the patient underwent colonoscopy as well as upper endoscopy with esophageal dilation.  Colonoscopy revealed a diminutive colon polyp, sigmoid diverticulosis.  She was seen by the GI physician assistant August 09, 2024 regarding vague abdominal discomfort and bloating.  I last saw the patient October 22, 2024 for follow-up.  In terms of GERD, she was asymptomatic post dilation on PPI.  Issues with transient irregular bowel habits resolved.  She was continued on pantoprazole  and told to follow-up in 1 year.  At some point shortly after her last visit she began to notice some blood on her undergarments when using the bathroom.  Sometimes on the tissue paper.  She did not describe rectal pain.  Not clear if it was associated with bowel movements.  Has not had bleeding in a couple of days.  She inquires about her recent calcium  chest CT.  She noticed small hiatal hernia on the report.  We discussed hiatal hernia.    REVIEW OF SYSTEMS:  All non-GI ROS negative.  Past Medical History:  Diagnosis Date   Allergic rhinitis    Asthma    in the past/ no meds   Blood transfusion without reported diagnosis    as a child   Fibroid    Hyperlipidemia     Past Surgical History:  Procedure Laterality Date   BREAST REDUCTION SURGERY     Bil/   CHOLECYSTECTOMY     FOOT SURGERY  2018   left foot surgery to shorten toe and remove damaged nerves/ still have pain/Dr Hewitt   valve in urteter     to control urine reflux/ age 89   WISDOM TOOTH EXTRACTION      Social History Hannah [name]  reports that she has never smoked. She has never used  smokeless tobacco. She reports current alcohol  use. She reports that she does not use drugs.  family history includes Alcohol  abuse in her father and mother; Breast cancer in her mother; Diabetes in her mother; Drug abuse in her brother; Heart disease in her father; Heart disease (age of onset: 84) in her brother; Kidney disease in her mother; Stroke in her mother.  Allergies[1]     PHYSICAL EXAMINATION: Vital signs: BP 117/70   Pulse 62   Ht 5' 3 (1.6 m)   Wt 138 lb 2 oz (62.7 kg)   BMI 24.47 kg/m   Constitutional: generally well-appearing, no acute distress Psychiatric: alert and oriented x3, cooperative Eyes: Anicteric  Abdomen: Not reexamined Rectal: No external abnormalities.  No internal mass or tenderness.  Small internal hemorrhoid.  Brown heme-negative stool Extremities: Unremarkable Skin: no relevant lesions on visible extremities Neuro: Grossly intact  ASSESSMENT:  1.  Blood on the undergarments as described.  May be small internal hemorrhoid.  Last colonoscopy 1 year ago. 2.  GERD complicated by peptic stricture.  Asymptomatic post dilation on PPI 3.  History of adenomatous polyps.  Surveillance up-to-date 4.  General Medical problems.  Under the care of Dr. Stephane  PLAN:  1.  Prescribed Anusol  suppositories.  1 at night as needed.  For bleeding, would treat for 1 week.  Use on demand. 2.  Advised that if she continues to notice blood in her undergarments unrelated to defecation he should see her GYN to make sure this is not vaginal bleeding.  She understands and agrees 3.  Reflux precautions 4.  Continue 5.  Routine GI follow-up 1 year         [1]  Allergies Allergen Reactions   Justicia Adhatoda Anaphylaxis, Diarrhea, Rash, Hives, Palpitations and Nausea Only   Other Anaphylaxis    Blueberries and walnut    Peanut-Containing Drug Products Anaphylaxis    Walnuts, blueberries   Testosterone  Hives    hives   Tree Extract Anaphylaxis, Diarrhea and Hives     Other Reaction(s): flushing, irregular heart rate, vomiting   Vaccinium Angustifolium Anaphylaxis   Aspirin     REACTION: bruises very easily   Dairy Aid [Tilactase] Other (See Comments)    Patient denies allergy to dairy   Codeine     itching  Other Reaction(s): Unknown   Grass Pollen([id number]-Swt Vern) Itching    itching   Molds & Smuts Other (See Comments)    Runny nose   Pollen Extract Itching    Other Reaction(s): Not available   Voltaren  [Diclofenac ] Itching    Voltaren  gel- makes her feel weird

## 2024-11-26 ENCOUNTER — Other Ambulatory Visit: Payer: Self-pay | Admitting: Nurse Practitioner
